# Patient Record
Sex: Female | Born: 1985 | Race: White | Hispanic: No | Marital: Single | Smoking: Current every day smoker
Health system: Southern US, Community
[De-identification: ages and names within clinical notes are randomized; demographics above are authoritative.]

## PROBLEM LIST (undated history)

## (undated) DIAGNOSIS — E282 Polycystic ovarian syndrome: Secondary | ICD-10-CM

## (undated) DIAGNOSIS — J45909 Unspecified asthma, uncomplicated: Secondary | ICD-10-CM

## (undated) HISTORY — PX: TONSILLECTOMY: SHX5217

## (undated) HISTORY — DX: Unspecified asthma, uncomplicated: J45.909

## (undated) HISTORY — DX: Polycystic ovarian syndrome: E28.2

## (undated) HISTORY — PX: CHOLECYSTECTOMY: SHX55

---

## 2007-06-14 ENCOUNTER — Ambulatory Visit: Payer: Self-pay | Admitting: Physician Assistant

## 2008-03-18 ENCOUNTER — Emergency Department: Payer: Self-pay | Admitting: Emergency Medicine

## 2009-03-01 IMAGING — US US PELV - US TRANSVAGINAL
1 series · 17 of 25 positions shown · non-contrast
Comparison: none

REASON FOR EXAM: Pain, history of PCOS
COMMENTS:

[Series 1: us pelv - us transvaginal · 17 of 32 slices shown]
[im 1/32]
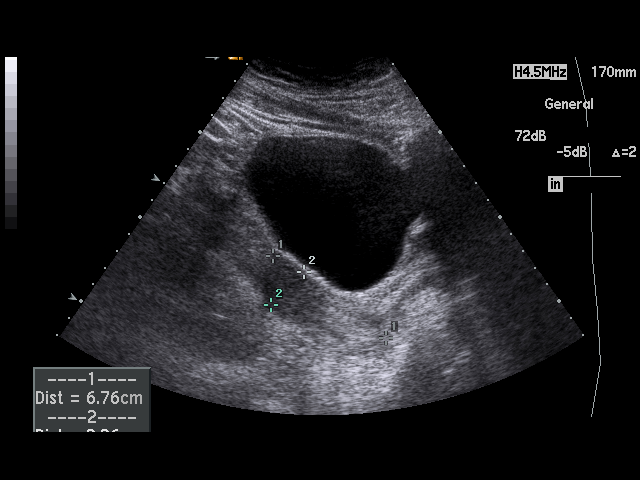
[im 3/32]
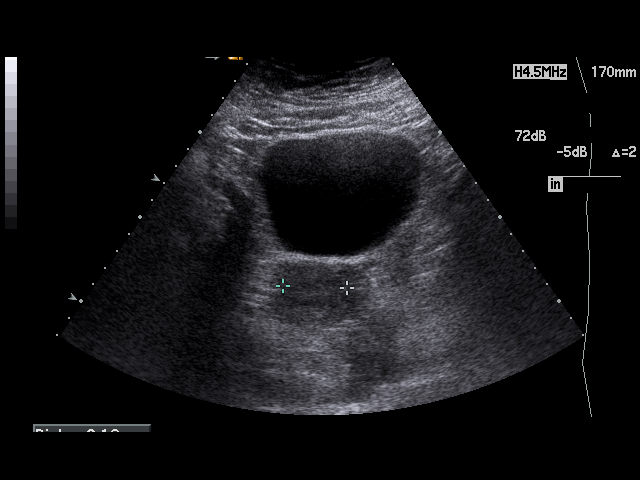
[im 4/32]
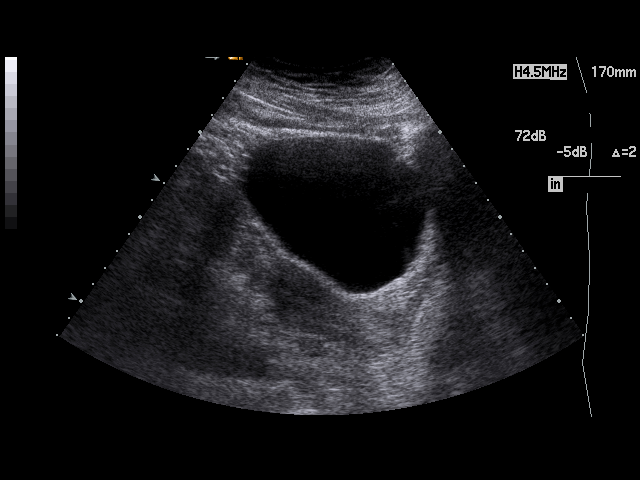
[im 7/32]
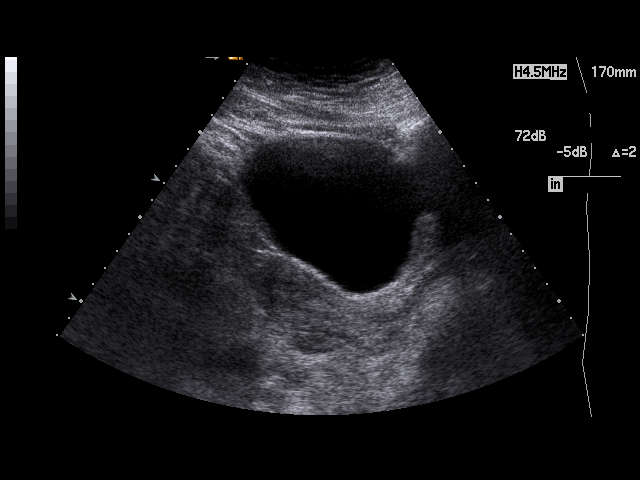
[im 8/32]
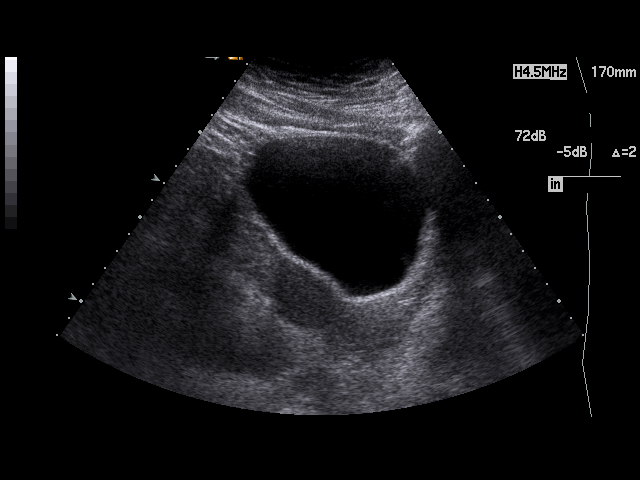
[im 11/32]
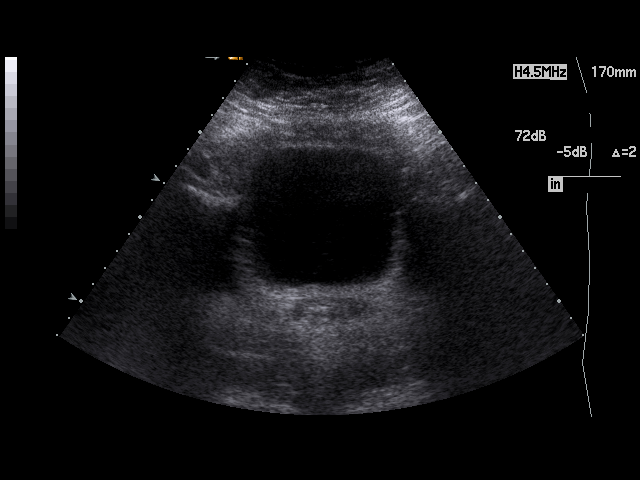
[im 12/32]
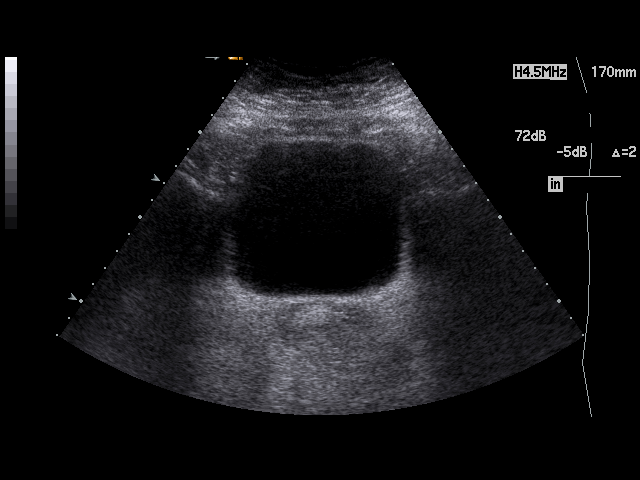
[im 15/32]
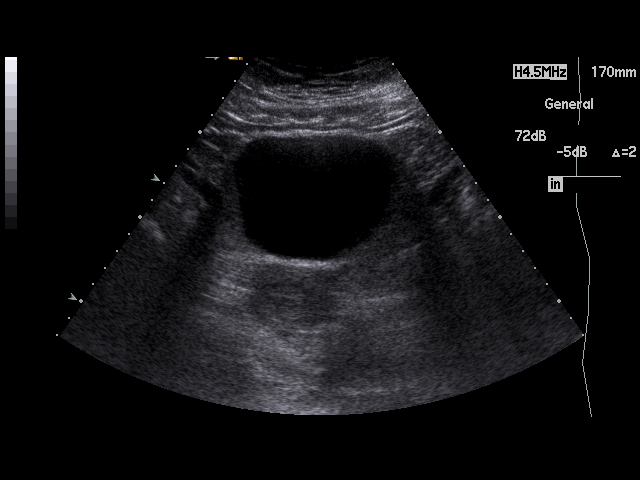
[im 16/32]
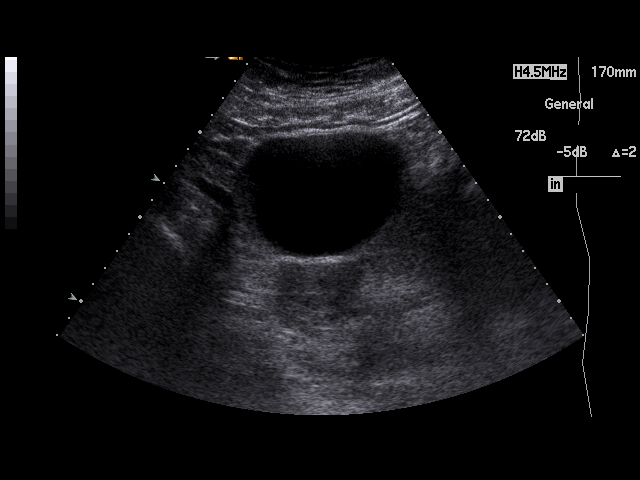
[im 17/32]
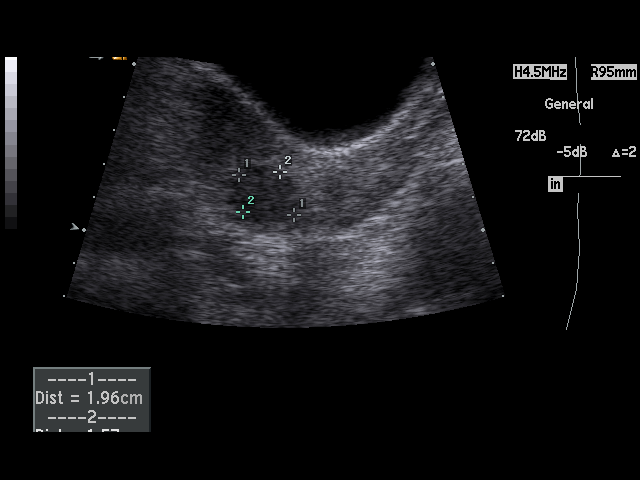
[im 20/32]
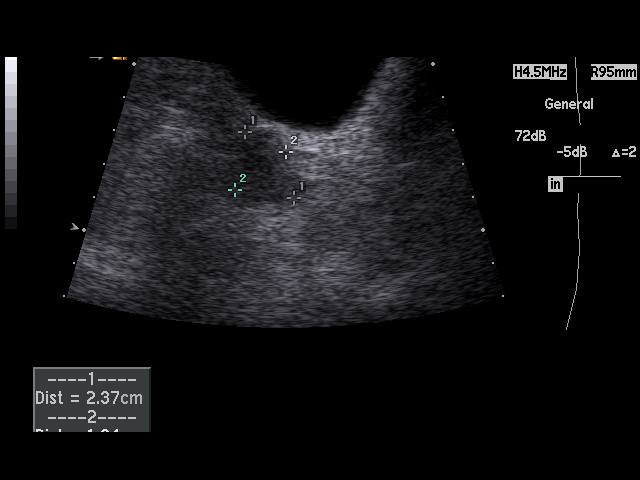
[im 21/32]
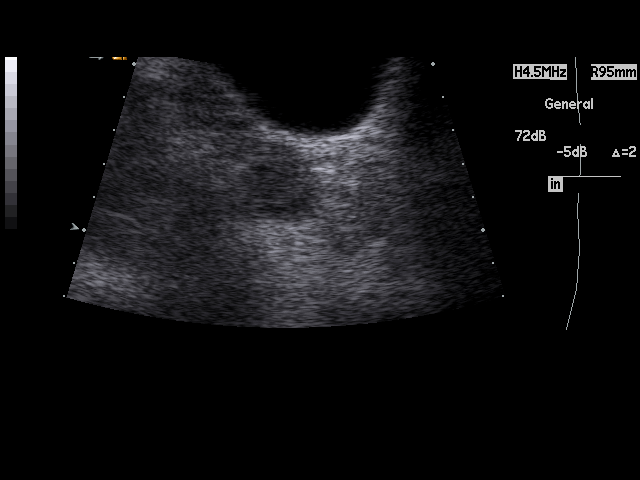
[im 24/32]
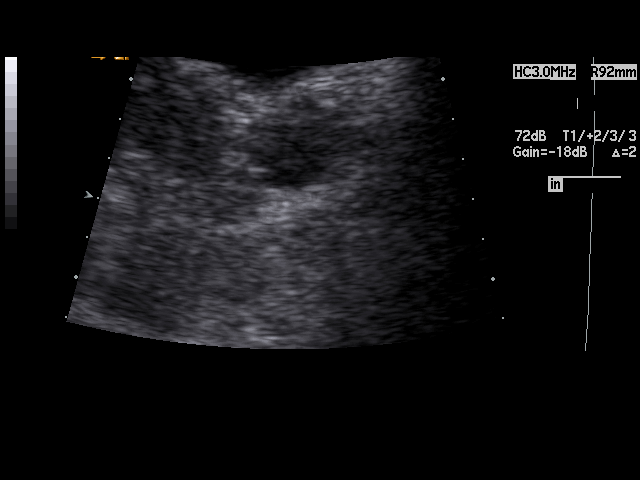
[im 25/32]
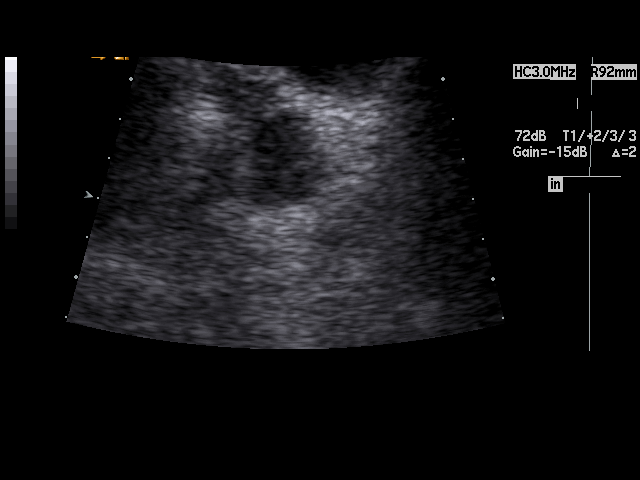
[im 28/32]
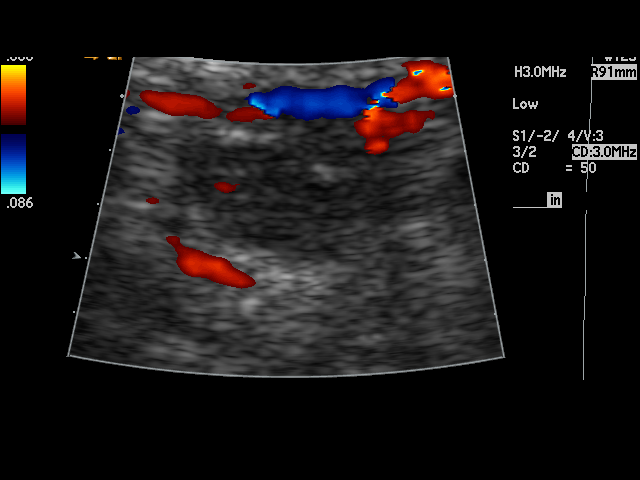
[im 29/32]
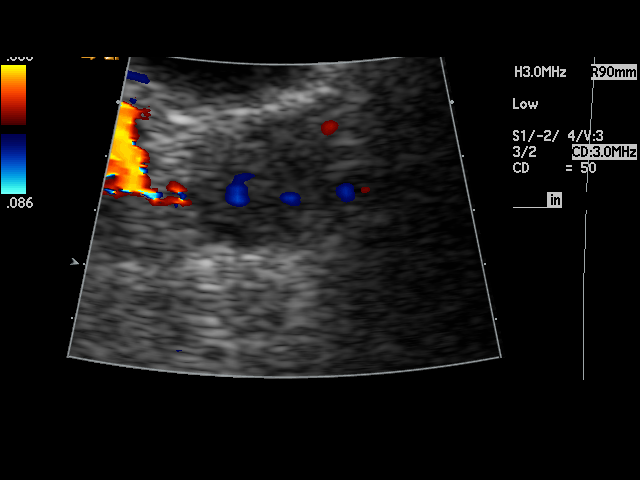
[im 32/32]
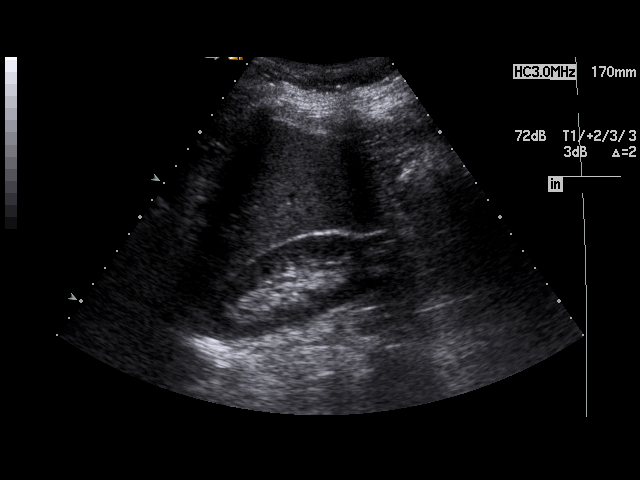

[17 of 25 positions shown; findings below may reference images not displayed]

PROCEDURE:     US  - US PELVIS MASS EXAM  - [DATE] [DATE] [DATE]  [DATE]

RESULT:     The uterus measures 6.76 x 2.26 x 3.1 cm. The endometrial
thickness is 2.5 mm. The RIGHT ovary measures 1.96 x 1.57 x 1.45 cm. The
LEFT ovary measures 2.37 x 1.84 x 3.31 cm. The urinary bladder is distended
with urine. There is no evidence of pelvic masses, free fluid or loculated
fluid collections.
IMPRESSION: Unremarkable Pelvic Ultrasound as described above.

## 2010-04-20 ENCOUNTER — Emergency Department: Payer: Self-pay | Admitting: Emergency Medicine

## 2010-09-07 ENCOUNTER — Emergency Department: Payer: Self-pay | Admitting: Emergency Medicine

## 2011-09-01 ENCOUNTER — Emergency Department: Payer: Self-pay | Admitting: *Deleted

## 2011-09-01 LAB — URINALYSIS, COMPLETE
Bilirubin,UR: NEGATIVE
Glucose,UR: NEGATIVE mg/dL (ref 0–75)
Ketone: NEGATIVE
Nitrite: NEGATIVE
Ph: 5 (ref 4.5–8.0)
Protein: NEGATIVE
WBC UR: 3 /HPF (ref 0–5)

## 2011-09-01 LAB — COMPREHENSIVE METABOLIC PANEL
Albumin: 3.9 g/dL (ref 3.4–5.0)
Alkaline Phosphatase: 55 U/L (ref 50–136)
Calcium, Total: 8.9 mg/dL (ref 8.5–10.1)
Chloride: 104 mmol/L (ref 98–107)
Co2: 29 mmol/L (ref 21–32)
Glucose: 121 mg/dL — ABNORMAL HIGH (ref 65–99)
Potassium: 3.5 mmol/L (ref 3.5–5.1)
SGOT(AST): 29 U/L (ref 15–37)
SGPT (ALT): 51 U/L
Total Protein: 7.4 g/dL (ref 6.4–8.2)

## 2011-09-01 LAB — CBC
HCT: 44.1 % (ref 35.0–47.0)
RDW: 12.3 % (ref 11.5–14.5)

## 2011-09-01 LAB — PREGNANCY, URINE: Pregnancy Test, Urine: NEGATIVE m[IU]/mL

## 2012-02-09 ENCOUNTER — Emergency Department: Payer: Self-pay | Admitting: Emergency Medicine

## 2013-10-11 ENCOUNTER — Emergency Department: Payer: Self-pay | Admitting: Emergency Medicine

## 2015-03-11 ENCOUNTER — Ambulatory Visit: Payer: Self-pay | Admitting: Family Medicine

## 2015-04-04 ENCOUNTER — Ambulatory Visit (INDEPENDENT_AMBULATORY_CARE_PROVIDER_SITE_OTHER): Payer: Self-pay | Admitting: Family Medicine

## 2015-04-04 ENCOUNTER — Encounter: Payer: Self-pay | Admitting: Family Medicine

## 2015-04-04 VITALS — BP 102/70 | HR 87 | Temp 98.0°F | Resp 16 | Ht 60.0 in | Wt 211.0 lb

## 2015-04-04 DIAGNOSIS — R5383 Other fatigue: Secondary | ICD-10-CM

## 2015-04-04 DIAGNOSIS — IMO0001 Reserved for inherently not codable concepts without codable children: Secondary | ICD-10-CM

## 2015-04-04 DIAGNOSIS — B353 Tinea pedis: Secondary | ICD-10-CM

## 2015-04-04 DIAGNOSIS — Z136 Encounter for screening for cardiovascular disorders: Secondary | ICD-10-CM

## 2015-04-04 DIAGNOSIS — J4520 Mild intermittent asthma, uncomplicated: Secondary | ICD-10-CM | POA: Insufficient documentation

## 2015-04-04 DIAGNOSIS — Z13220 Encounter for screening for lipoid disorders: Secondary | ICD-10-CM

## 2015-04-04 DIAGNOSIS — E282 Polycystic ovarian syndrome: Secondary | ICD-10-CM

## 2015-04-04 MED ORDER — TERBINAFINE HCL 1 % EX CREA: 1.0000 "application " | TOPICAL_CREAM | Freq: Two times a day (BID) | CUTANEOUS | Status: DC

## 2015-04-04 MED ORDER — SPIRONOLACTONE 25 MG PO TABS: 25.0000 mg | ORAL_TABLET | Freq: Every day | ORAL | Status: DC

## 2015-04-04 NOTE — Progress Notes (Signed)
Name: Shelley Cole   MRN: [id number]    DOB: Nov 13, 1985   Date:04/04/2015       Progress Note  Subjective  Chief Complaint  Chief Complaint  Patient presents with  . Establish Care  . Fatigue    HPI  Shelley Cole is a 29 y.o. female here today to transition care of medical needs to a primary care provider. She reports a past medical history of intermittent Asthma, has not needed her rescue inhaler in several months. [name] also reports history of PCOS diagnosis due to hirsutism (chin, jaw, anterior chest wall), irregular menses, obesity. Diagnosis confirmed with pelvic ultrasound and lab testing. Was prescribed spironolactone (dose unknown to patient) which helped with symptoms and tendency for LE swelling. 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 None today. Also notes some itching and irritation at webs of toes of both feet. Using OTC athlete's foot medication powder which is not helping.  Shelley Cole complains of fatigue today. Onset about 2-3 months ago. She is obese with a BMI of 41 and reports no regular exercise or diet changes in efforts to lose weight.     Past Medical History  Diagnosis Date  . PCOS (polycystic ovarian syndrome)   . Asthma     Patient Active Problem List   Diagnosis Date Noted  . Mild intermittent asthma in adult without complication 04/04/2015  . Other fatigue 04/04/2015  . PCOS (polycystic ovarian syndrome) 04/04/2015    Social History  Substance Use Topics  . Smoking status: Current Every Day Smoker    Types: Cigarettes  . Smokeless tobacco: Not on file  . Alcohol Use: No    No current outpatient prescriptions on file.  Past Surgical History  Procedure Laterality Date  . Cholecystectomy    . Tonsillectomy      Family History  Problem Relation Age of Onset  . COPD Father   . Emphysema Father   . Cancer Maternal Grandmother     pncreatic  . Cancer  Maternal Grandfather     nose  . Diabetes Maternal Grandfather   . Arthritis Paternal Grandmother   . COPD Paternal Grandmother   . Emphysema Paternal Grandmother     Allergies  Allergen Reactions  . Sulfa Antibiotics Nausea And Vomiting     Review of Systems  CONSTITUTIONAL: No significant weight changes, fever, chills, weakness. Yes fatigue.  HEENT:  - Eyes: No visual changes.  - Ears: No auditory changes. No pain.  - Nose: No sneezing, congestion, runny nose. - Throat: No sore throat. No changes in swallowing. SKIN: Yes skin itching. CARDIOVASCULAR: No chest pain, chest pressure or chest discomfort. No palpitations. Yes intermittent edema.  RESPIRATORY: No shortness of breath, cough or sputum.  GASTROINTESTINAL: No anorexia, nausea, vomiting. No changes in bowel habits. No abdominal pain or blood.  GENITOURINARY: No dysuria. No frequency. No discharge.  NEUROLOGICAL: No headache, dizziness, syncope, paralysis, ataxia, numbness or tingling in the extremities. No memory changes. No change in bowel or bladder control.  MUSCULOSKELETAL: No joint pain. No muscle pain. HEMATOLOGIC: No anemia, bleeding or bruising.  LYMPHATICS: No enlarged lymph nodes.  PSYCHIATRIC: No change in mood. No change in sleep pattern.  ENDOCRINOLOGIC: No reports of sweating, cold or heat intolerance. No polyuria or polydipsia.     Objective  BP 102/70 mmHg  Pulse 87  Temp(Src) 98 F (36.7 C) (Oral)  Resp 16  Ht 5' (1.524 m)  Wt 211 lb (95.709 kg)  BMI 41.21 kg/m2  SpO2 96%  LMP  Body mass index is 41.21 kg/(m^2).  Physical Exam  Constitutional: Patient is obese and well-nourished. In no distress.  HEENT:  - Head: Normocephalic and atraumatic.  - Ears: Bilateral TMs gray, no erythema or effusion - Nose: Nasal mucosa moist - Mouth/Throat: Oropharynx is clear and moist. No tonsillar hypertrophy or erythema. No post nasal drainage.  - Eyes: Conjunctivae clear, EOM movements normal. PERRLA.  No scleral icterus.  Neck: Normal range of motion. Neck supple. No JVD present. No thyromegaly present.  Cardiovascular: Normal rate, regular rhythm and normal heart sounds.  No murmur heard.  Pulmonary/Chest: Effort normal and breath sounds normal. No respiratory distress. Musculoskeletal: Normal range of motion bilateral UE and LE, no joint effusions. Peripheral vascular: Bilateral LE no edema. Bilateral feet some peeling skin and redness in between webs of toes in both feet.  Neurological: CN II-XII grossly intact with no focal deficits. Alert and oriented to person, place, and time. Coordination, balance, strength, speech and gait are normal.  Skin: Skin is warm and dry. Although she makes great effort to reduce visible female pattern hair, close observation reveals thick hair at chin, jaw line, anterior chest wall. Psychiatric: Patient has a normal mood and affect. Behavior is normal in office today. Judgment and thought content normal in office today.  Assessment & Plan  1. Mild intermittent asthma in adult without complication Stable.  2. Other fatigue Likely multifactorial. May be associated with obesity, daily work duties, stress, pcos. Will keep mood disorder in mind.  - CBC with Differential/Platelet - Comprehensive metabolic panel - Hemoglobin A1c - TSH  3. PCOS (polycystic ovarian syndrome) Re started spironolactone on patient's request, counseled on low BP potential.  - Lipid panel - Hemoglobin A1c - TSH - spironolactone (ALDACTONE) 25 MG tablet; Take 1 tablet (25 mg total) by mouth daily.  Dispense: 30 tablet; Refill: 5  4. Encounter for cholesteral screening for cardiovascular disease  - Lipid panel  5. Tinea pedis of both feet Change shoes, breathable foot wear recommended. Wear cotton socks.  - terbinafine (LAMISIL AT) 1 % cream; Apply 1 application topically 2 (two) times daily.  Dispense: 42 g; Refill: 1

## 2015-04-10 LAB — COMPREHENSIVE METABOLIC PANEL
A/G RATIO: 1.8 (ref 1.1–2.5)
ALK PHOS: 60 IU/L (ref 39–117)
ALT: 46 IU/L — AB (ref 0–32)
AST: 29 IU/L (ref 0–40)
Albumin: 4.6 g/dL (ref 3.5–5.5)
BILIRUBIN TOTAL: 0.6 mg/dL (ref 0.0–1.2)
BUN/Creatinine Ratio: 11 (ref 8–20)
BUN: 13 mg/dL (ref 6–20)
CO2: 26 mmol/L (ref 18–29)
Calcium: 9.5 mg/dL (ref 8.7–10.2)
Chloride: 102 mmol/L (ref 97–106)
Creatinine, Ser: 1.15 mg/dL — ABNORMAL HIGH (ref 0.57–1.00)
GFR calc Af Amer: 75 mL/min/{1.73_m2} (ref >59)
GFR calc non Af Amer: 65 mL/min/{1.73_m2} (ref >59)
GLUCOSE: 96 mg/dL (ref 65–99)
Globulin, Total: 2.5 g/dL (ref 1.5–4.5)
POTASSIUM: 4.7 mmol/L (ref 3.5–5.2)
Sodium: 141 mmol/L (ref 136–144)
Total Protein: 7.1 g/dL (ref 6.0–8.5)

## 2015-04-10 LAB — LIPID PANEL
CHOLESTEROL TOTAL: 213 mg/dL — AB (ref 100–199)
Chol/HDL Ratio: 5.9 ratio units — ABNORMAL HIGH (ref 0.0–4.4)
HDL: 36 mg/dL — ABNORMAL LOW (ref >39)
LDL Calculated: 128 mg/dL — ABNORMAL HIGH (ref 0–99)
TRIGLYCERIDES: 245 mg/dL — AB (ref 0–149)
VLDL Cholesterol Cal: 49 mg/dL — ABNORMAL HIGH (ref 5–40)

## 2015-04-10 LAB — CBC WITH DIFFERENTIAL/PLATELET
BASOS ABS: 0 10*3/uL (ref 0.0–0.2)
BASOS: 0 %
EOS (ABSOLUTE): 0.3 10*3/uL (ref 0.0–0.4)
Eos: 3 %
Hematocrit: 46.6 % (ref 34.0–46.6)
Hemoglobin: 16.5 g/dL — ABNORMAL HIGH (ref 11.1–15.9)
Immature Grans (Abs): 0 10*3/uL (ref 0.0–0.1)
Immature Granulocytes: 0 %
Lymphocytes Absolute: 2.6 10*3/uL (ref 0.7–3.1)
Lymphs: 31 %
MCH: 32.9 pg (ref 26.6–33.0)
MCHC: 35.4 g/dL (ref 31.5–35.7)
MCV: 93 fL (ref 79–97)
MONOS ABS: 0.6 10*3/uL (ref 0.1–0.9)
Monocytes: 7 %
Neutrophils Absolute: 4.9 10*3/uL (ref 1.4–7.0)
Neutrophils: 59 %
PLATELETS: 273 10*3/uL (ref 150–379)
RBC: 5.01 x10E6/uL (ref 3.77–5.28)
RDW: 12.9 % (ref 12.3–15.4)
WBC: 8.4 10*3/uL (ref 3.4–10.8)

## 2015-04-10 LAB — HEMOGLOBIN A1C
Est. average glucose Bld gHb Est-mCnc: 117 mg/dL
HEMOGLOBIN A1C: 5.7 % — AB (ref 4.8–5.6)

## 2015-04-10 LAB — TSH: TSH: 1.48 u[IU]/mL (ref 0.450–4.500)

## 2016-03-19 ENCOUNTER — Ambulatory Visit (INDEPENDENT_AMBULATORY_CARE_PROVIDER_SITE_OTHER): Payer: PRIVATE HEALTH INSURANCE | Admitting: Obstetrics and Gynecology

## 2016-03-19 ENCOUNTER — Encounter: Payer: Self-pay | Admitting: Obstetrics and Gynecology

## 2016-03-19 VITALS — BP 107/74 | HR 83 | Ht 60.0 in | Wt 210.6 lb

## 2016-03-19 DIAGNOSIS — E282 Polycystic ovarian syndrome: Secondary | ICD-10-CM

## 2016-03-19 DIAGNOSIS — E661 Drug-induced obesity: Secondary | ICD-10-CM

## 2016-03-19 DIAGNOSIS — Z6841 Body Mass Index (BMI) 40.0 and over, adult: Secondary | ICD-10-CM | POA: Diagnosis not present

## 2016-03-19 DIAGNOSIS — Z309 Encounter for contraceptive management, unspecified: Secondary | ICD-10-CM | POA: Diagnosis not present

## 2016-03-19 DIAGNOSIS — Z720 Tobacco use: Secondary | ICD-10-CM

## 2016-03-19 DIAGNOSIS — Z01419 Encounter for gynecological examination (general) (routine) without abnormal findings: Secondary | ICD-10-CM | POA: Diagnosis not present

## 2016-03-19 NOTE — Patient Instructions (Signed)
Health Maintenance, Female Adopting a healthy lifestyle and getting preventive care can go a long way to promote health and wellness. Talk with your health care provider about what schedule of regular examinations is right for you. This is a good chance for you to check in with your provider about disease prevention and staying healthy. In between checkups, there are plenty of things you can do on your own. Experts have done a lot of research about which lifestyle changes and preventive measures are most likely to keep you healthy. Ask your health care provider for more information. WEIGHT AND DIET  Eat a healthy diet  Be sure to include plenty of vegetables, fruits, low-fat dairy products, and lean protein.  Do not eat a lot of foods high in solid fats, added sugars, or salt.  Get regular exercise. This is one of the most important things you can do for your health.  Most adults should exercise for at least 150 minutes each week. The exercise should increase your heart rate and make you sweat (moderate-intensity exercise).  Most adults should also do strengthening exercises at least twice a week. This is in addition to the moderate-intensity exercise.  Maintain a healthy weight  Body mass index (BMI) is a measurement that can be used to identify possible weight problems. It estimates body fat based on height and weight. Your health care provider can help determine your BMI and help you achieve or maintain a healthy weight.  For females 20 years of age and older:   A BMI below 18.5 is considered underweight.  A BMI of 18.5 to 24.9 is normal.  A BMI of 25 to 29.9 is considered overweight.  A BMI of 30 and above is considered obese.  Watch levels of cholesterol and blood lipids  You should start having your blood tested for lipids and cholesterol at 29 years of age, then have this test every 5 years.  You may need to have your cholesterol levels checked more often if:  Your lipid  or cholesterol levels are high.  You are older than 29 years of age.  You are at high risk for heart disease.  CANCER SCREENING   Lung Cancer  Lung cancer screening is recommended for adults 55-80 years old who are at high risk for lung cancer because of a history of smoking.  A yearly low-dose CT scan of the lungs is recommended for people who:  Currently smoke.  Have quit within the past 15 years.  Have at least a 30-pack-year history of smoking. A pack year is smoking an average of one pack of cigarettes a day for 1 year.  Yearly screening should continue until it has been 15 years since you quit.  Yearly screening should stop if you develop a health problem that would prevent you from having lung cancer treatment.  Breast Cancer  Practice breast self-awareness. This means understanding how your breasts normally appear and feel.  It also means doing regular breast self-exams. Let your health care provider know about any changes, no matter how small.  If you are in your 20s or 30s, you should have a clinical breast exam (CBE) by a health care provider every 1-3 years as part of a regular health exam.  If you are 40 or older, have a CBE every year. Also consider having a breast X-ray (mammogram) every year.  If you have a family history of breast cancer, talk to your health care provider about genetic screening.  If you   are at high risk for breast cancer, talk to your health care provider about having an MRI and a mammogram every year.  Breast cancer gene (BRCA) assessment is recommended for women who have family members with BRCA-related cancers. BRCA-related cancers include:  Breast.  Ovarian.  Tubal.  Peritoneal cancers.  Results of the assessment will determine the need for genetic counseling and BRCA1 and BRCA2 testing. Cervical Cancer Your health care provider may recommend that you be screened regularly for cancer of the pelvic organs (ovaries, uterus, and  vagina). This screening involves a pelvic examination, including checking for microscopic changes to the surface of your cervix (Pap test). You may be encouraged to have this screening done every 3 years, beginning at age 21.  For women ages 30-65, health care providers may recommend pelvic exams and Pap testing every 3 years, or they may recommend the Pap and pelvic exam, combined with testing for human papilloma virus (HPV), every 5 years. Some types of HPV increase your risk of cervical cancer. Testing for HPV may also be done on women of any age with unclear Pap test results.  Other health care providers may not recommend any screening for nonpregnant women who are considered low risk for pelvic cancer and who do not have symptoms. Ask your health care provider if a screening pelvic exam is right for you.  If you have had past treatment for cervical cancer or a condition that could lead to cancer, you need Pap tests and screening for cancer for at least 20 years after your treatment. If Pap tests have been discontinued, your risk factors (such as having a new sexual partner) need to be reassessed to determine if screening should resume. Some women have medical problems that increase the chance of getting cervical cancer. In these cases, your health care provider may recommend more frequent screening and Pap tests. Colorectal Cancer  This type of cancer can be detected and often prevented.  Routine colorectal cancer screening usually begins at 29 years of age and continues through 29 years of age.  Your health care provider may recommend screening at an earlier age if you have risk factors for colon cancer.  Your health care provider may also recommend using home test kits to check for hidden blood in the stool.  A small camera at the end of a tube can be used to examine your colon directly (sigmoidoscopy or colonoscopy). This is done to check for the earliest forms of colorectal  cancer.  Routine screening usually begins at age 50.  Direct examination of the colon should be repeated every 5-10 years through 29 years of age. However, you may need to be screened more often if early forms of precancerous polyps or small growths are found. Skin Cancer  Check your skin from head to toe regularly.  Tell your health care provider about any new moles or changes in moles, especially if there is a change in a mole's shape or color.  Also tell your health care provider if you have a mole that is larger than the size of a pencil eraser.  Always use sunscreen. Apply sunscreen liberally and repeatedly throughout the day.  Protect yourself by wearing long sleeves, pants, a wide-brimmed hat, and sunglasses whenever you are outside. HEART DISEASE, DIABETES, AND HIGH BLOOD PRESSURE   High blood pressure causes heart disease and increases the risk of stroke. High blood pressure is more likely to develop in:  People who have blood pressure in the high end   of the normal range (130-139/85-89 mm Hg).  People who are overweight or obese.  People who are African American.  If you are 38-23 years of age, have your blood pressure checked every 3-5 years. If you are 61 years of age or older, have your blood pressure checked every year. You should have your blood pressure measured twice--once when you are at a hospital or clinic, and once when you are not at a hospital or clinic. Record the average of the two measurements. To check your blood pressure when you are not at a hospital or clinic, you can use:  An automated blood pressure machine at a pharmacy.  A home blood pressure monitor.  If you are between 45 years and 39 years old, ask your health care provider if you should take aspirin to prevent strokes.  Have regular diabetes screenings. This involves taking a blood sample to check your fasting blood sugar level.  If you are at a normal weight and have a low risk for diabetes,  have this test once every three years after 29 years of age.  If you are overweight and have a high risk for diabetes, consider being tested at a younger age or more often. PREVENTING INFECTION  Hepatitis B  If you have a higher risk for hepatitis B, you should be screened for this virus. You are considered at high risk for hepatitis B if:  You were born in a country where hepatitis B is common. Ask your health care provider which countries are considered high risk.  Your parents were born in a high-risk country, and you have not been immunized against hepatitis B (hepatitis B vaccine).  You have HIV or AIDS.  You use needles to inject street drugs.  You live with someone who has hepatitis B.  You have had sex with someone who has hepatitis B.  You get hemodialysis treatment.  You take certain medicines for conditions, including cancer, organ transplantation, and autoimmune conditions. Hepatitis C  Blood testing is recommended for:  Everyone born from [birth year] through 1964.  Anyone with known risk factors for hepatitis C. Sexually transmitted infections (STIs)  You should be screened for sexually transmitted infections (STIs) including gonorrhea and chlamydia if:  You are sexually active and are younger than 29 years of age.  You are older than 29 years of age and your health care provider tells you that you are at risk for this type of infection.  Your sexual activity has changed since you were last screened and you are at an increased risk for chlamydia or gonorrhea. Ask your health care provider if you are at risk.  If you do not have HIV, but are at risk, it may be recommended that you take a prescription medicine daily to prevent HIV infection. This is called pre-exposure prophylaxis (PrEP). You are considered at risk if:  You are sexually active and do not regularly use condoms or know the HIV status of your partner(s).  You take drugs by injection.  You are sexually  active with a partner who has HIV. Talk with your health care provider about whether you are at high risk of being infected with HIV. If you choose to begin PrEP, you should first be tested for HIV. You should then be tested every 3 months for as long as you are taking PrEP.  PREGNANCY   If you are premenopausal and you may become pregnant, ask your health care provider about preconception counseling.  If you may  become pregnant, take 400 to 800 micrograms (mcg) of folic acid every day.  If you want to prevent pregnancy, talk to your health care provider about birth control (contraception). OSTEOPOROSIS AND MENOPAUSE   Osteoporosis is a disease in which the bones lose minerals and strength with aging. This can result in serious bone fractures. Your risk for osteoporosis can be identified using a bone density scan.  If you are 61 years of age or older, or if you are at risk for osteoporosis and fractures, ask your health care provider if you should be screened.  Ask your health care provider whether you should take a calcium or vitamin D supplement to lower your risk for osteoporosis.  Menopause may have certain physical symptoms and risks.  Hormone replacement therapy may reduce some of these symptoms and risks. Talk to your health care provider about whether hormone replacement therapy is right for you.  HOME CARE INSTRUCTIONS   Schedule regular health, dental, and eye exams.  Stay current with your immunizations.   Do not use any tobacco products including cigarettes, chewing tobacco, or electronic cigarettes.  If you are pregnant, do not drink alcohol.  If you are breastfeeding, limit how much and how often you drink alcohol.  Limit alcohol intake to no more than 1 drink per day for nonpregnant women. One drink equals 12 ounces of beer, 5 ounces of wine, or 1 ounces of hard liquor.  Do not use street drugs.  Do not share needles.  Ask your health care provider for help if  you need support or information about quitting drugs.  Tell your health care provider if you often feel depressed.  Tell your health care provider if you have ever been abused or do not feel safe at home.   This information is not intended to replace advice given to you by your health care provider. Make sure you discuss any questions you have with your health care provider.   Document Released: 11/17/2010 Document Revised: 05/25/2014 Document Reviewed: 04/05/2013 Elsevier Interactive Patient Education [employer].

## 2016-03-19 NOTE — Progress Notes (Signed)
ANNUAL PREVENTATIVE CARE GYN  ENCOUNTER NOTE  Subjective:       Shelley Cole is a 30 y.o. G0P0000 female here for a routine annual gynecologic exam.  Current complaints: 1.  Refill spironolactone  2. wants tsh drawn  Long history of PCO since age 30. Has irregular cycles, usually more than 4 per year. Occasional intermenstrual No history of menorrhagia. Recently was seen in primary care; needs TSH testing. Long history of tobacco use.         Gynecologic History Patient's last menstrual period was 11/16/2015 (approximate). Contraception: none Last Pap: .? Results were: normal Last mammogram: n/a.  Obstetric History OB History  Gravida Para Term Preterm AB Living  0 0 0 0 0 0  SAB TAB Ectopic Multiple Live Births  0 0 0 0 0        Past Medical History:  Diagnosis Date  . Asthma   . PCOS (polycystic ovarian syndrome)     Past Surgical History:  Procedure Laterality Date  . CHOLECYSTECTOMY    . TONSILLECTOMY      Current Outpatient Prescriptions on File Prior to Visit  Medication Sig Dispense Refill  . spironolactone (ALDACTONE) 25 MG tablet Take 1 tablet (25 mg total) by mouth daily. 30 tablet 5   No current facility-administered medications on file prior to visit.     Allergies  Allergen Reactions  . Sulfa Antibiotics Nausea And Vomiting    Social History   Social History  . Marital status: Single    Spouse name: N/A  . Number of children: N/A  . Years of education: N/A   Occupational History  . Not on file.   Social History Main Topics  . Smoking status: Current Every Day Smoker    Types: Cigarettes  . Smokeless tobacco: Not on file  . Alcohol use No  . Drug use: No  . Sexual activity: Yes    Partners: Male   Other Topics Concern  . Not on file   Social History Narrative  . No narrative on file    Family History  Problem Relation Age of Onset  . COPD Father   . Emphysema Father   . Cancer Maternal Grandmother     pncreatic  .  Cancer Maternal Grandfather     nose  . Diabetes Maternal Grandfather   . Arthritis Paternal Grandmother   . COPD Paternal Grandmother   . Emphysema Paternal Grandmother     The following portions of the patient's history were reviewed and updated as appropriate: allergies, current medications, past family history, past medical history, past social history, past surgical history and problem list.  Review of Systems ROS Review of Systems - General ROS: negative for - chills, fatigue, fever, hot flashes, night sweats, weight gain or weight loss Psychological ROS: negative for - anxiety, decreased libido, depression, mood swings, physical abuse or sexual abuse Ophthalmic ROS: negative for - blurry vision, eye pain or loss of vision ENT ROS: negative for - headaches, hearing change, visual changes or vocal changes Allergy and Immunology ROS: negative for - hives, itchy/watery eyes or seasonal allergies Hematological and Lymphatic ROS: negative for - bleeding problems, bruising, swollen lymph nodes or weight loss Endocrine ROS: negative for - galactorrhea, hair pattern changes, hot flashes, malaise/lethargy, mood swings, palpitations, polydipsia/polyuria, skin changes, temperature intolerance or unexpected weight changes Breast ROS: negative for - new or changing breast lumps or nipple discharge Respiratory ROS: negative for - cough or shortness of breath Cardiovascular ROS: negative  for - chest pain, irregular heartbeat, palpitations or shortness of breath Gastrointestinal ROS: no abdominal pain, change in bowel habits, or black or bloody stools Genito-Urinary ROS: no dysuria, trouble voiding, or hematuria Musculoskeletal ROS: negative for - joint pain or joint stiffness Neurological ROS: negative for - bowel and bladder control changes Dermatological ROS: negative for rash and skin lesion changes   Objective:   BP 107/74   Pulse 83   Ht 5' (1.524 m)   Wt 210 lb 10.1 oz (95.5 kg)   LMP  11/16/2015 (Approximate)   BMI 41.14 kg/m  CONSTITUTIONAL: Well-developed, well-nourished female in no acute distress.  PSYCHIATRIC: Normal mood and affect. Normal behavior. Normal judgment and thought content. NEUROLGIC: Alert and oriented to person, place, and time. Normal muscle tone coordination. No cranial nerve deficit noted. HENT:  Normocephalic, atraumatic, External right and left ear normal. Oropharynx is clear and moist EYES: Conjunctivae and EOM are normal. Pupils are equal, round, and reactive to light. No scleral icterus.  NECK: Normal range of motion, supple, no masses.  Normal thyroid.  SKIN: Skin is warm and dry. No rash noted. Not diaphoretic. No erythema. No pallor. CARDIOVASCULAR: Normal heart rate noted, regular rhythm, no murmur. RESPIRATORY: Clear to auscultation bilaterally. Effort and breath sounds normal, no problems with respiration noted. BREASTS: Symmetric in size. No masses, skin changes, nipple drainage, or lymphadenopathy. ABDOMEN: Soft, normal bowel sounds, no distention noted.  No tenderness, rebound or guarding.  BLADDER: Normal PELVIC:  External Genitalia: Normal  BUS: Normal  Vagina: Normal  Cervix: Normal; no lesions; no cervical motion tenderness  Uterus: Normal; midplane, normal size and shape, mobile, nontender  Adnexa: Normal  RV: External Exam NormaI  MUSCULOSKELETAL: Normal range of motion. No tenderness.  No cyanosis, clubbing, or edema.  2+ distal pulses. LYMPHATIC: No Axillary, Supraclavicular, or Inguinal Adenopathy.    Assessment:   Annual gynecologic examination 30 y.o. Contraception: none bmi-41 PCO Irregular menstrual cycles Contraception-not desired  tobacco user Plan:  Pap: Pap, Reflex if ASCUS Mammogram: Not Indicated Stool Guaiac Testing:  Not Indicated Labs:TSH only per pt Routine preventative health maintenance measures emphasized: Exercise/Diet/Weight control, Tobacco Warnings, Alcohol/Substance use risks, Stress  Management, Peer Pressure Issues and Safe Sex Spironolactone 25 mg twice a day Return to Clinic - 1 Year   Crystal The [place], CMA  Herold HarmsMartin A Jakari Sada, MD  Note: This dictation was prepared with Dragon dictation along with smaller phrase technology. Any transcriptional errors that result from this process are unintentional.

## 2016-03-20 ENCOUNTER — Other Ambulatory Visit: Payer: Self-pay

## 2016-03-20 DIAGNOSIS — E282 Polycystic ovarian syndrome: Secondary | ICD-10-CM

## 2016-03-20 LAB — TSH: TSH: 2.88 u[IU]/mL (ref 0.450–4.500)

## 2016-03-20 MED ORDER — SPIRONOLACTONE 25 MG PO TABS: 25.0000 mg | ORAL_TABLET | Freq: Two times a day (BID) | ORAL | 6 refills | Status: DC

## 2016-03-20 NOTE — Telephone Encounter (Signed)
Done by gyn

## 2016-03-20 NOTE — Addendum Note (Signed)
Addended by: Marchelle FolksMILLER, Hammond Obeirne G on: 03/20/2016 02:43 PM   Modules accepted: Orders

## 2016-03-24 LAB — PAP IG W/ RFLX HPV ASCU: PAP Smear Comment: 0

## 2017-03-22 NOTE — Progress Notes (Signed)
ANNUAL PREVENTATIVE CARE GYN  ENCOUNTER NOTE  Subjective:       Shelley Cole is a 31 y.o. G0P0000 female here for a routine annual gynecologic exam.  Current complaints: 1.  Refill spironolactone- wants to increase it    Hair growth is still an issue as she does have to shave periodically.  She is on Spironolactone 50 mg a day. Patient has had 5 cycles this year to date.  No significant intermenstrual spotting or bleeding. Bowel and bladder function are normal.  Long history of PCO since age 31. Has irregular cycles, usually more than 4 per year. Occasional intermenstrual spotting.  No history of menorrhagia. Long history of tobacco use.       Gynecologic History LMP- 03/19/2017 Contraception: none Last Pap: .2017 neg Results were: normal Last mammogram: n/a.  Obstetric History OB History  Gravida Para Term Preterm AB Living  0 0 0 0 0 0  SAB TAB Ectopic Multiple Live Births  0 0 0 0 0        Past Medical History:  Diagnosis Date  . Asthma   . PCOS (polycystic ovarian syndrome)     Past Surgical History:  Procedure Laterality Date  . CHOLECYSTECTOMY    . TONSILLECTOMY      Current Outpatient Medications on File Prior to Visit  Medication Sig Dispense Refill  . spironolactone (ALDACTONE) 25 MG tablet Take 1 tablet (25 mg total) by mouth 2 (two) times daily. 60 tablet 6   No current facility-administered medications on file prior to visit.     Allergies  Allergen Reactions  . Sulfa Antibiotics Nausea And Vomiting    Social History   Socioeconomic History  . Marital status: Single    Spouse name: Not on file  . Number of children: Not on file  . Years of education: Not on file  . Highest education level: Not on file  Social Needs  . Financial resource strain: Not on file  . Food insecurity - worry: Not on file  . Food insecurity - inability: Not on file  . Transportation needs - medical: Not on file  . Transportation needs - non-medical: Not on  file  Occupational History  . Not on file  Tobacco Use  . Smoking status: Current Every Day Smoker    Packs/day: 0.25    Types: Cigarettes  . Smokeless tobacco: Never Used  Substance and Sexual Activity  . Alcohol use: Yes    Alcohol/week: 0.0 oz    Comment: occas  . Drug use: No  . Sexual activity: Yes    Partners: Male    Birth control/protection: None  Other Topics Concern  . Not on file  Social History Narrative  . Not on file    Family History  Problem Relation Age of Onset  . COPD Father   . Emphysema Father   . Cancer Maternal Grandmother        pncreatic  . Cancer Maternal Grandfather        nose  . Diabetes Maternal Grandfather   . Arthritis Paternal Grandmother   . COPD Paternal Grandmother   . Emphysema Paternal Grandmother   . Breast cancer Neg Hx   . Ovarian cancer Neg Hx   . Colon cancer Neg Hx     The following portions of the patient's history were reviewed and updated as appropriate: allergies, current medications, past family history, past medical history, past social history, past surgical history and problem list.  Review of Systems  Review of Systems  Constitutional: Negative.   HENT: Negative.   Eyes: Negative.   Respiratory: Negative.   Cardiovascular: Negative.   Gastrointestinal: Negative.   Genitourinary: Negative.   Musculoskeletal: Negative.   Skin: Negative.   Neurological: Negative.   Endo/Heme/Allergies:       Chronic hair growth persists due to PCO  Psychiatric/Behavioral: Negative.       BP 109/70   Pulse 75   Ht 5' (1.524 m)   Wt 214 lb 4.8 oz (97.2 kg)   LMP 03/19/2017 (Exact Date)   BMI 41.85 kg/m  CONSTITUTIONAL: Well-developed, well-nourished female in no acute distress.  PSYCHIATRIC: Normal mood and affect. Normal behavior. Normal judgment and thought content. NEUROLGIC: Alert and oriented to person, place, and time. Normal muscle tone coordination. No cranial nerve deficit noted. HENT:  Normocephalic,  atraumatic, External right and left ear normal. Oropharynx is clear and moist.  Temporal balding noted EYES: Conjunctivae and EOM are normal. No scleral icterus.  NECK: Normal range of motion, supple, no masses.  Normal thyroid.  SKIN: Skin is warm and dry. No rash noted. Not diaphoretic. No erythema. No pallor. CARDIOVASCULAR: Normal heart rate noted, regular rhythm, no murmur. RESPIRATORY: Clear to auscultation bilaterally. Effort and breath sounds normal, no problems with respiration noted. BREASTS: Symmetric in size. No masses, skin changes, nipple drainage, or lymphadenopathy. ABDOMEN: Soft, normal bowel sounds, no distention noted.  No tenderness, rebound or guarding.  BLADDER: Normal PELVIC:  External Genitalia: Normal  BUS: Normal  Vagina: Normal; minimal menstrual blood in vault  Cervix: Normal; no lesions; no cervical motion tenderness  Uterus: Normal; midplane, normal size and shape, mobile, nontender  Adnexa: Normal  RV: External Exam NormaI  MUSCULOSKELETAL: Normal range of motion. No tenderness.  No cyanosis, clubbing, or edema.  2+ distal pulses. LYMPHATIC: No Axillary, Supraclavicular, or Inguinal Adenopathy.    Assessment:   Annual gynecologic examination 31 y.o. Contraception: none bmi-41 PCO Irregular menstrual cycles Contraception-not desired  tobacco user  Plan:  Pap: PAP/hpv Mammogram: Not Indicated Stool Guaiac Testing:  Not Indicated Labs:tsh lipid fbs a1c Routine preventative health maintenance measures emphasized: Exercise/Diet/Weight control, Tobacco Warnings, Alcohol/Substance use risks, Stress Management, Peer Pressure Issues and Safe Sex  Increase spironolactone to 100 mg daily Strongly encouraged to quit smoking Return to Clinic - 1 Year   Crystal [place], CMA  Herold Harms, MD  Note: This dictation was prepared with Dragon dictation along with smaller phrase technology. Any transcriptional errors that result from this process are  unintentional.

## 2017-03-24 ENCOUNTER — Ambulatory Visit (INDEPENDENT_AMBULATORY_CARE_PROVIDER_SITE_OTHER): Payer: PRIVATE HEALTH INSURANCE | Admitting: Obstetrics and Gynecology

## 2017-03-24 ENCOUNTER — Encounter: Payer: Self-pay | Admitting: Obstetrics and Gynecology

## 2017-03-24 VITALS — BP 109/70 | HR 75 | Ht 60.0 in | Wt 214.3 lb

## 2017-03-24 DIAGNOSIS — E282 Polycystic ovarian syndrome: Secondary | ICD-10-CM

## 2017-03-24 DIAGNOSIS — Z6841 Body Mass Index (BMI) 40.0 and over, adult: Secondary | ICD-10-CM | POA: Diagnosis not present

## 2017-03-24 DIAGNOSIS — Z309 Encounter for contraceptive management, unspecified: Secondary | ICD-10-CM | POA: Insufficient documentation

## 2017-03-24 DIAGNOSIS — Z720 Tobacco use: Secondary | ICD-10-CM | POA: Diagnosis not present

## 2017-03-24 DIAGNOSIS — Z01419 Encounter for gynecological examination (general) (routine) without abnormal findings: Secondary | ICD-10-CM | POA: Diagnosis not present

## 2017-03-24 DIAGNOSIS — E661 Drug-induced obesity: Secondary | ICD-10-CM

## 2017-03-24 MED ORDER — SPIRONOLACTONE 100 MG PO TABS: 100.0000 mg | ORAL_TABLET | Freq: Every day | ORAL | 11 refills | Status: DC

## 2017-03-24 NOTE — Patient Instructions (Signed)
1.  Pap smear is done 2.  Self breast awareness is encouraged 3.  Screening labs are ordered 4.  Continue with healthy eating, exercise, and control weight loss.  Weight loss goal is 1 pound per month 5.  Increase spironolactone to 100 mg daily for PCO management of excess hair growth 6.  Contraception-none 7.  Return in 1 year for physical  Health Maintenance, Female Adopting a healthy lifestyle and getting preventive care can go a long way to promote health and wellness. Talk with your health care provider about what schedule of regular examinations is right for you. This is a good chance for you to check in with your provider about disease prevention and staying healthy. In between checkups, there are plenty of things you can do on your own. Experts have done a lot of research about which lifestyle changes and preventive measures are most likely to keep you healthy. Ask your health care provider for more information. Weight and diet Eat a healthy diet  Be sure to include plenty of vegetables, fruits, low-fat dairy products, and lean protein.  Do not eat a lot of foods high in solid fats, added sugars, or salt.  Get regular exercise. This is one of the most important things you can do for your health. ? Most adults should exercise for at least 150 minutes each week. The exercise should increase your heart rate and make you sweat (moderate-intensity exercise). ? Most adults should also do strengthening exercises at least twice a week. This is in addition to the moderate-intensity exercise.  Maintain a healthy weight  Body mass index (BMI) is a measurement that can be used to identify possible weight problems. It estimates body fat based on height and weight. Your health care provider can help determine your BMI and help you achieve or maintain a healthy weight.  For females 69 years of age and older: ? A BMI below 18.5 is considered underweight. ? A BMI of 18.5 to 24.9 is normal. ? A  BMI of 25 to 29.9 is considered overweight. ? A BMI of 30 and above is considered obese.  Watch levels of cholesterol and blood lipids  You should start having your blood tested for lipids and cholesterol at 30 years of age, then have this test every 5 years.  You may need to have your cholesterol levels checked more often if: ? Your lipid or cholesterol levels are high. ? You are older than 30 years of age. ? You are at high risk for heart disease.  Cancer screening Lung Cancer  Lung cancer screening is recommended for adults 41-32 years old who are at high risk for lung cancer because of a history of smoking.  A yearly low-dose CT scan of the lungs is recommended for people who: ? Currently smoke. ? Have quit within the past 15 years. ? Have at least a 30-pack-year history of smoking. A pack year is smoking an average of one pack of cigarettes a day for 1 year.  Yearly screening should continue until it has been 15 years since you quit.  Yearly screening should stop if you develop a health problem that would prevent you from having lung cancer treatment.  Breast Cancer  Practice breast self-awareness. This means understanding how your breasts normally appear and feel.  It also means doing regular breast self-exams. Let your health care provider know about any changes, no matter how small.  If you are in your 20s or 30s, you should have a  clinical breast exam (CBE) by a health care provider every 1-3 years as part of a regular health exam.  If you are 26 or older, have a CBE every year. Also consider having a breast X-ray (mammogram) every year.  If you have a family history of breast cancer, talk to your health care provider about genetic screening.  If you are at high risk for breast cancer, talk to your health care provider about having an MRI and a mammogram every year.  Breast cancer gene (BRCA) assessment is recommended for women who have family members with  BRCA-related cancers. BRCA-related cancers include: ? Breast. ? Ovarian. ? Tubal. ? Peritoneal cancers.  Results of the assessment will determine the need for genetic counseling and BRCA1 and BRCA2 testing.  Cervical Cancer Your health care provider may recommend that you be screened regularly for cancer of the pelvic organs (ovaries, uterus, and vagina). This screening involves a pelvic examination, including checking for microscopic changes to the surface of your cervix (Pap test). You may be encouraged to have this screening done every 3 years, beginning at age 30.  For women ages 20-65, health care providers may recommend pelvic exams and Pap testing every 3 years, or they may recommend the Pap and pelvic exam, combined with testing for human papilloma virus (HPV), every 5 years. Some types of HPV increase your risk of cervical cancer. Testing for HPV may also be done on women of any age with unclear Pap test results.  Other health care providers may not recommend any screening for nonpregnant women who are considered low risk for pelvic cancer and who do not have symptoms. Ask your health care provider if a screening pelvic exam is right for you.  If you have had past treatment for cervical cancer or a condition that could lead to cancer, you need Pap tests and screening for cancer for at least 20 years after your treatment. If Pap tests have been discontinued, your risk factors (such as having a new sexual partner) need to be reassessed to determine if screening should resume. Some women have medical problems that increase the chance of getting cervical cancer. In these cases, your health care provider may recommend more frequent screening and Pap tests.  Colorectal Cancer  This type of cancer can be detected and often prevented.  Routine colorectal cancer screening usually begins at 30 years of age and continues through 30 years of age.  Your health care provider may recommend screening  at an earlier age if you have risk factors for colon cancer.  Your health care provider may also recommend using home test kits to check for hidden blood in the stool.  A small camera at the end of a tube can be used to examine your colon directly (sigmoidoscopy or colonoscopy). This is done to check for the earliest forms of colorectal cancer.  Routine screening usually begins at age 3.  Direct examination of the colon should be repeated every 5-10 years through 30 years of age. However, you may need to be screened more often if early forms of precancerous polyps or small growths are found.  Skin Cancer  Check your skin from head to toe regularly.  Tell your health care provider about any new moles or changes in moles, especially if there is a change in a mole's shape or color.  Also tell your health care provider if you have a mole that is larger than the size of a pencil eraser.  Always use sunscreen.  Apply sunscreen liberally and repeatedly throughout the day.  Protect yourself by wearing long sleeves, pants, a wide-brimmed hat, and sunglasses whenever you are outside.  Heart disease, diabetes, and high blood pressure  High blood pressure causes heart disease and increases the risk of stroke. High blood pressure is more likely to develop in: ? People who have blood pressure in the high end of the normal range (130-139/85-89 mm Hg). ? People who are overweight or obese. ? People who are African American.  If you are 36-61 years of age, have your blood pressure checked every 3-5 years. If you are 33 years of age or older, have your blood pressure checked every year. You should have your blood pressure measured twice-once when you are at a hospital or clinic, and once when you are not at a hospital or clinic. Record the average of the two measurements. To check your blood pressure when you are not at a hospital or clinic, you can use: ? An automated blood pressure machine at a  pharmacy. ? A home blood pressure monitor.  If you are between 50 years and 65 years old, ask your health care provider if you should take aspirin to prevent strokes.  Have regular diabetes screenings. This involves taking a blood sample to check your fasting blood sugar level. ? If you are at a normal weight and have a low risk for diabetes, have this test once every three years after 30 years of age. ? If you are overweight and have a high risk for diabetes, consider being tested at a younger age or more often. Preventing infection Hepatitis B  If you have a higher risk for hepatitis B, you should be screened for this virus. You are considered at high risk for hepatitis B if: ? You were born in a country where hepatitis B is common. Ask your health care provider which countries are considered high risk. ? Your parents were born in a high-risk country, and you have not been immunized against hepatitis B (hepatitis B vaccine). ? You have HIV or AIDS. ? You use needles to inject street drugs. ? You live with someone who has hepatitis B. ? You have had sex with someone who has hepatitis B. ? You get hemodialysis treatment. ? You take certain medicines for conditions, including cancer, organ transplantation, and autoimmune conditions.  Hepatitis C  Blood testing is recommended for: ? Everyone born from [birth year] through 1964. ? Anyone with known risk factors for hepatitis C.  Sexually transmitted infections (STIs)  You should be screened for sexually transmitted infections (STIs) including gonorrhea and chlamydia if: ? You are sexually active and are younger than 30 years of age. ? You are older than 31 years of age and your health care provider tells you that you are at risk for this type of infection. ? Your sexual activity has changed since you were last screened and you are at an increased risk for chlamydia or gonorrhea. Ask your health care provider if you are at risk.  If you do not  have HIV, but are at risk, it may be recommended that you take a prescription medicine daily to prevent HIV infection. This is called pre-exposure prophylaxis (PrEP). You are considered at risk if: ? You are sexually active and do not regularly use condoms or know the HIV status of your partner(s). ? You take drugs by injection. ? You are sexually active with a partner who has HIV.  Talk with your health care  provider about whether you are at high risk of being infected with HIV. If you choose to begin PrEP, you should first be tested for HIV. You should then be tested every 3 months for as long as you are taking PrEP. Pregnancy  If you are premenopausal and you may become pregnant, ask your health care provider about preconception counseling.  If you may become pregnant, take 400 to 800 micrograms (mcg) of folic acid every day.  If you want to prevent pregnancy, talk to your health care provider about birth control (contraception). Osteoporosis and menopause  Osteoporosis is a disease in which the bones lose minerals and strength with aging. This can result in serious bone fractures. Your risk for osteoporosis can be identified using a bone density scan.  If you are 33 years of age or older, or if you are at risk for osteoporosis and fractures, ask your health care provider if you should be screened.  Ask your health care provider whether you should take a calcium or vitamin D supplement to lower your risk for osteoporosis.  Menopause may have certain physical symptoms and risks.  Hormone replacement therapy may reduce some of these symptoms and risks. Talk to your health care provider about whether hormone replacement therapy is right for you. Follow these instructions at home:  Schedule regular health, dental, and eye exams.  Stay current with your immunizations.  Do not use any tobacco products including cigarettes, chewing tobacco, or electronic cigarettes.  If you are pregnant,  do not drink alcohol.  If you are breastfeeding, limit how much and how often you drink alcohol.  Limit alcohol intake to no more than 1 drink per day for nonpregnant women. One drink equals 12 ounces of beer, 5 ounces of wine, or 1 ounces of hard liquor.  Do not use street drugs.  Do not share needles.  Ask your health care provider for help if you need support or information about quitting drugs.  Tell your health care provider if you often feel depressed.  Tell your health care provider if you have ever been abused or do not feel safe at home. This information is not intended to replace advice given to you by your health care provider. Make sure you discuss any questions you have with your health care provider. Document Released: 11/17/2010 Document Revised: 10/10/2015 Document Reviewed: 02/05/2015 Elsevier Interactive Patient Education  [employer].

## 2017-04-02 ENCOUNTER — Other Ambulatory Visit: Payer: PRIVATE HEALTH INSURANCE

## 2017-04-02 LAB — IGP, COBASHPV16/18
HPV 16: NEGATIVE
HPV 18: NEGATIVE
HPV OTHER HR TYPES: NEGATIVE
PAP SMEAR COMMENT: 0

## 2017-04-05 ENCOUNTER — Other Ambulatory Visit: Payer: PRIVATE HEALTH INSURANCE

## 2017-04-06 LAB — HEMOGLOBIN A1C
Est. average glucose Bld gHb Est-mCnc: 117 mg/dL
HEMOGLOBIN A1C: 5.7 % — AB (ref 4.8–5.6)

## 2017-04-06 LAB — LIPID PANEL
CHOL/HDL RATIO: 5.9 ratio — AB (ref 0.0–4.4)
Cholesterol, Total: 219 mg/dL — ABNORMAL HIGH (ref 100–199)
HDL: 37 mg/dL — ABNORMAL LOW (ref >39)
Triglycerides: 408 mg/dL — ABNORMAL HIGH (ref 0–149)

## 2017-04-06 LAB — TSH: TSH: 2.48 u[IU]/mL (ref 0.450–4.500)

## 2017-04-06 LAB — GLUCOSE, RANDOM: GLUCOSE: 91 mg/dL (ref 65–99)

## 2017-04-07 ENCOUNTER — Encounter: Payer: PRIVATE HEALTH INSURANCE | Admitting: Obstetrics and Gynecology

## 2017-04-07 ENCOUNTER — Encounter: Payer: Self-pay | Admitting: Obstetrics and Gynecology

## 2017-04-07 ENCOUNTER — Ambulatory Visit (INDEPENDENT_AMBULATORY_CARE_PROVIDER_SITE_OTHER): Payer: PRIVATE HEALTH INSURANCE | Admitting: Obstetrics and Gynecology

## 2017-04-07 VITALS — BP 104/68 | HR 76 | Ht 60.0 in | Wt 219.0 lb

## 2017-04-07 DIAGNOSIS — A5901 Trichomonal vulvovaginitis: Secondary | ICD-10-CM | POA: Diagnosis not present

## 2017-04-07 DIAGNOSIS — E782 Mixed hyperlipidemia: Secondary | ICD-10-CM | POA: Diagnosis not present

## 2017-04-07 MED ORDER — METRONIDAZOLE 500 MG PO TABS: 500.0000 mg | ORAL_TABLET | Freq: Two times a day (BID) | ORAL | 0 refills | Status: DC

## 2017-04-07 NOTE — Progress Notes (Signed)
Chief complaint: 1.  Abnormal Pap smear-trichomonas 2.  Elevated triglycerides, cholesterol, and hemoglobin A1c  Shelley Cole presents today for confirmation of trichomonas vaginalis infection. Review of screening labs obtained from prior annual exam was completed.  Past Medical History:  Diagnosis Date  . Asthma   . PCOS (polycystic ovarian syndrome)    Past Surgical History:  Procedure Laterality Date  . CHOLECYSTECTOMY    . TONSILLECTOMY      OBJECTIVE: BP 104/68   Pulse 76   Ht 5' (1.524 m)   Wt 219 lb (99.3 kg)   LMP 03/19/2017 (Exact Date)   BMI 42.77 kg/m  Pleasant female in no acute distress Pelvic exam: External genitalia-normal BUS-normal Vagina-minimal gray white secretions in the vault  PROCEDURE: Wet prep  Normal saline-moderate white blood cells present; motile Trichomonas seen  ASSESSMENT: 1.  Trichomonas vaginalis 2.  Hypertriglyceridemia 3.  Elevated hemoglobin A1c consistent with prediabetes  PLAN: 1.  Wet prep is noted 2.  Flagyl 500 mg twice a day for 7 days 3.  Referral to primary care for evaluation and management of hypertriglyceridemia and elevated hemoglobin A1c  A total of 15 minutes were spent face-to-face with the patient during this encounter and over half of that time dealt with counseling and coordination of care.  Herold HarmsMartin A Nicoli Nardozzi, MD  Note: This dictation was prepared with Dragon dictation along with smaller phrase technology. Any transcriptional errors that result from this process are unintentional.

## 2017-04-07 NOTE — Patient Instructions (Signed)
1.  Flagyl 500 mg twice a day for 7 days 2.  Recommend referral to primary care for management of elevated hemoglobin A1c (prediabetes) and hypertriglyceridemia.  Call back with provider name so we can make the appropriate referral for you.

## 2017-10-05 ENCOUNTER — Encounter: Payer: Self-pay | Admitting: General Surgery

## 2018-03-28 NOTE — Progress Notes (Deleted)
ANNUAL PREVENTATIVE CARE GYN  ENCOUNTER NOTE  Subjective:       Shelley Cole is a 32 y.o. G0P0000 female here for a routine annual gynecologic exam.  Current complaints: 1.     Hair growth is still an issue as she does have to shave periodically.  She is on Spironolactone 100 mg a day. Patient has had 5 cycles this year to date.  No significant intermenstrual spotting or bleeding. Bowel and bladder function are normal.  Long history of PCO since age 30. Has irregular cycles, usually more than 4 per year. Occasional intermenstrual spotting.  No history of menorrhagia. Long history of tobacco use.       Gynecologic History LMP-  Contraception: none Last Pap: 03/2017 neg Results were: normal Last mammogram: n/a.  Obstetric History OB History  Gravida Para Term Preterm AB Living  0 0 0 0 0 0  SAB TAB Ectopic Multiple Live Births  0 0 0 0 0    Past Medical History:  Diagnosis Date  . Asthma   . PCOS (polycystic ovarian syndrome)     Past Surgical History:  Procedure Laterality Date  . CHOLECYSTECTOMY    . TONSILLECTOMY      Current Outpatient Medications on File Prior to Visit  Medication Sig Dispense Refill  . metroNIDAZOLE (FLAGYL) 500 MG tablet Take 1 tablet (500 mg total) by mouth 2 (two) times daily. 14 tablet 0  . spironolactone (ALDACTONE) 100 MG tablet Take 1 tablet (100 mg total) daily by mouth. 30 tablet 11   No current facility-administered medications on file prior to visit.     Allergies  Allergen Reactions  . Sulfa Antibiotics Nausea And Vomiting    Social History   Socioeconomic History  . Marital status: Single    Spouse name: Not on file  . Number of children: Not on file  . Years of education: Not on file  . Highest education level: Not on file  Occupational History  . Not on file  Social Needs  . Financial resource strain: Not on file  . Food insecurity:    Worry: Not on file    Inability: Not on file  . Transportation needs:   Medical: Not on file    Non-medical: Not on file  Tobacco Use  . Smoking status: Current Every Day Smoker    Packs/day: 0.25    Types: Cigarettes  . Smokeless tobacco: Never Used  Substance and Sexual Activity  . Alcohol use: Yes    Alcohol/week: 0.0 standard drinks    Comment: rare  . Drug use: No  . Sexual activity: Yes    Partners: Male    Birth control/protection: None  Lifestyle  . Physical activity:    Days per week: 0 days    Minutes per session: 0 min  . Stress: Not at all  Relationships  . Social connections:    Talks on phone: Not on file    Gets together: Not on file    Attends religious service: Not on file    Active member of club or organization: Not on file    Attends meetings of clubs or organizations: Not on file    Relationship status: Not on file  . Intimate partner violence:    Fear of current or ex partner: No    Emotionally abused: No    Physically abused: No    Forced sexual activity: No  Other Topics Concern  . Not on file  Social History Narrative  .  Not on file    Family History  Problem Relation Age of Onset  . COPD Father   . Emphysema Father   . Cancer Maternal Grandmother        pncreatic  . Cancer Maternal Grandfather        nose  . Diabetes Maternal Grandfather   . Arthritis Paternal Grandmother   . COPD Paternal Grandmother   . Emphysema Paternal Grandmother   . Breast cancer Neg Hx   . Ovarian cancer Neg Hx   . Colon cancer Neg Hx     The following portions of the patient's history were reviewed and updated as appropriate: allergies, current medications, past family history, past medical history, past social history, past surgical history and problem list.  Review of Systems      There were no vitals taken for this visit. CONSTITUTIONAL: Well-developed, well-nourished female in no acute distress.  PSYCHIATRIC: Normal mood and affect. Normal behavior. Normal judgment and thought content. NEUROLGIC: Alert and oriented  to person, place, and time. Normal muscle tone coordination. No cranial nerve deficit noted. HENT:  Normocephalic, atraumatic, External right and left ear normal. Oropharynx is clear and moist.  Temporal balding noted EYES: Conjunctivae and EOM are normal. No scleral icterus.  NECK: Normal range of motion, supple, no masses.  Normal thyroid.  SKIN: Skin is warm and dry. No rash noted. Not diaphoretic. No erythema. No pallor. CARDIOVASCULAR: Normal heart rate noted, regular rhythm, no murmur. RESPIRATORY: Clear to auscultation bilaterally. Effort and breath sounds normal, no problems with respiration noted. BREASTS: Symmetric in size. No masses, skin changes, nipple drainage, or lymphadenopathy. ABDOMEN: Soft, normal bowel sounds, no distention noted.  No tenderness, rebound or guarding.  BLADDER: Normal PELVIC:  External Genitalia: Normal  BUS: Normal  Vagina: Normal; minimal menstrual blood in vault  Cervix: Normal; no lesions; no cervical motion tenderness  Uterus: Normal; midplane, normal size and shape, mobile, nontender  Adnexa: Normal  RV: External Exam NormaI  MUSCULOSKELETAL: Normal range of motion. No tenderness.  No cyanosis, clubbing, or edema.  2+ distal pulses. LYMPHATIC: No Axillary, Supraclavicular, or Inguinal Adenopathy.    Assessment:   Annual gynecologic examination 32 y.o. Contraception: none bmi- PCO Irregular menstrual cycles Contraception-not desired  tobacco user  Plan:  Pap: PAP/hpv Mammogram: Not Indicated Stool Guaiac Testing:  Not Indicated Labs:tsh lipid fbs a1c Routine preventative health maintenance measures emphasized: Exercise/Diet/Weight control, Tobacco Warnings, Alcohol/Substance use risks, Stress Management, Peer Pressure Issues and Safe Sex  Strongly encouraged to quit smoking Return to Clinic - 1 Year   Sheliah Plane [place], CMA    Britt Bottom CMA acting as scribe for Dr. Greggory Keen.   Note: This dictation was prepared with Dragon  dictation along with smaller phrase technology. Any transcriptional errors that result from this process are unintentional.

## 2018-03-29 ENCOUNTER — Encounter: Payer: PRIVATE HEALTH INSURANCE | Admitting: Obstetrics and Gynecology

## 2019-10-06 ENCOUNTER — Other Ambulatory Visit: Payer: Self-pay

## 2019-10-06 ENCOUNTER — Emergency Department
Admission: EM | Admit: 2019-10-06 | Discharge: 2019-10-06 | Disposition: A | Discharge status: Home / Self Care | Payer: PRIVATE HEALTH INSURANCE | Attending: Emergency Medicine | Admitting: Emergency Medicine

## 2019-10-06 ENCOUNTER — Emergency Department: Payer: PRIVATE HEALTH INSURANCE

## 2019-10-06 DIAGNOSIS — J4520 Mild intermittent asthma, uncomplicated: Secondary | ICD-10-CM | POA: Diagnosis not present

## 2019-10-06 DIAGNOSIS — J189 Pneumonia, unspecified organism: Secondary | ICD-10-CM | POA: Insufficient documentation

## 2019-10-06 DIAGNOSIS — R05 Cough: Secondary | ICD-10-CM | POA: Diagnosis present

## 2019-10-06 MED ORDER — METHYLPREDNISOLONE 4 MG PO TBPK: ORAL_TABLET | ORAL | 0 refills | Status: AC

## 2019-10-06 MED ORDER — IPRATROPIUM-ALBUTEROL 0.5-2.5 (3) MG/3ML IN SOLN
3.0000 mL | Freq: Once | RESPIRATORY_TRACT | Status: AC
  Administered 2019-10-06: 3 mL via RESPIRATORY_TRACT
  Filled 2019-10-06: qty 3

## 2019-10-06 MED ORDER — AZITHROMYCIN 250 MG PO TABS: ORAL_TABLET | ORAL | 0 refills | Status: AC

## 2019-10-06 MED ORDER — ALBUTEROL SULFATE HFA 108 (90 BASE) MCG/ACT IN AERS: 2.0000 | INHALATION_SPRAY | Freq: Four times a day (QID) | RESPIRATORY_TRACT | 0 refills | Status: AC | PRN

## 2019-10-06 NOTE — Discharge Instructions (Signed)
Follow-up with your regular doctor if not improving in 3 days.  Return emergency department worsening.  Take your medication as prescribed.  Do not take the amoxicillin.

## 2019-10-06 NOTE — ED Provider Notes (Signed)
[facility] Emergency Department Provider Note  ____________________________________________   First MD Initiated Contact with Patient 10/06/19 1008     (approximate)  I have reviewed the triage vital signs and the nursing notes.   HISTORY  Chief Complaint Cough and Nasal Congestion    HPI Shelley Cole is a 34 y.o. female presents emergency department with cough and congestion.  Denies fever.  Patient's been taking amoxicillin since Wednesday, x2 days.  She had a dental abscess.  Now she has had a lot of coughing and congestion along with productive mucus.  She denies fevers or chills.    Past Medical History:  Diagnosis Date  . Asthma   . PCOS (polycystic ovarian syndrome)     Patient Active Problem List   Diagnosis Date Noted  . 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 Encounter for contraceptive management 03/24/2017  . Mild intermittent asthma in adult without complication 04/04/2015  . Other fatigue 04/04/2015  . PCO (polycystic ovaries) 04/04/2015  . Encounter for cholesteral screening for cardiovascular disease 04/04/2015    Past Surgical History:  Procedure Laterality Date  . CHOLECYSTECTOMY    . TONSILLECTOMY      Prior to Admission medications   Medication Sig Start Date End Date Taking? Authorizing Provider  albuterol (VENTOLIN HFA) 108 (90 Base) MCG/ACT inhaler Inhale 2 puffs into the lungs every 6 (six) hours as needed for wheezing or shortness of breath. 10/06/19   Sherrie Mustache, Roselyn Bering, PA-C  azithromycin (ZITHROMAX Z-PAK) 250 MG tablet 2 pills today then 1 pill a day for 4 days 10/06/19   Sherrie Mustache Roselyn Bering, PA-C  methylPREDNISolone (MEDROL DOSEPAK) 4 MG TBPK tablet Take 6 pills on day one then decrease by 1 pill each day 10/06/19   Faythe Ghee, PA-C    Allergies Sulfa antibiotics and Banana  Family History  Problem Relation Age of Onset   . COPD Father   . Emphysema Father   . Cancer Maternal Grandmother        pncreatic  . Cancer Maternal Grandfather        nose  . Diabetes Maternal Grandfather   . Arthritis Paternal Grandmother   . COPD Paternal Grandmother   . Emphysema Paternal Grandmother   . Breast cancer Neg Hx   . Ovarian cancer Neg Hx   . Colon cancer Neg Hx     Social History Social History   Tobacco Use  . Smoking status: Current Every Day Smoker    Packs/day: 0.25    Types: Cigarettes  . Smokeless tobacco: Never Used  Substance Use Topics  . Alcohol use: Yes    Alcohol/week: 0.0 standard drinks    Comment: rare  . Drug use: No    Review of Systems  Constitutional: No fever/chills Eyes: No visual changes. ENT: No sore throat. Respiratory: Positive cough Cardiovascular: Denies chest pain Gastrointestinal: Denies abdominal pain Genitourinary: Negative for dysuria. Musculoskeletal: Negative for back pain. Skin: Negative for rash. Psychiatric: no mood changes,     ____________________________________________   PHYSICAL EXAM:  VITAL SIGNS: ED Triage Vitals  Enc Vitals Group     BP 10/06/19 0837 130/78     Pulse Rate 10/06/19 0837 99     Resp 10/06/19 0837 16     Temp 10/06/19 0837 98.5 F (36.9 C)     Temp Source 10/06/19 0837 Oral     SpO2 10/06/19 0837  95 %     Weight 10/06/19 0838 211 lb (95.7 kg)     Height 10/06/19 0838 5' (1.524 m)     Head Circumference --      Peak Flow --      Pain Score 10/06/19 0838 0     Pain Loc --      Pain Edu? --      Excl. in GC? --     Constitutional: Alert and oriented. Well appearing and in no acute distress. Eyes: Conjunctivae are normal.  Head: Atraumatic. Nose: No congestion/rhinnorhea. Mouth/Throat: Mucous membranes are moist.   Neck:  supple no lymphadenopathy noted Cardiovascular: Normal rate, regular rhythm. Heart sounds are normal Respiratory: Normal respiratory effort.  No retractions, lungs with wheezing bilateral GU:  deferred Musculoskeletal: FROM all extremities, warm and well perfused Neurologic:  Normal speech and language.  Skin:  Skin is warm, dry and intact. No rash noted. Psychiatric: Mood and affect are normal. Speech and behavior are normal.  ____________________________________________   LABS (all labs ordered are listed, but only abnormal results are displayed)  Labs Reviewed - No data to display ____________________________________________   ____________________________________________  RADIOLOGY  Chest x-ray shows a left lower lobe pneumonia  ____________________________________________   PROCEDURES  Procedure(s) performed: DuoNeb   Procedures    ____________________________________________   INITIAL IMPRESSION / ASSESSMENT AND PLAN / ED COURSE  Pertinent labs & imaging results that were available during my care of the patient were reviewed by me and considered in my medical decision making (see chart for details).   Patient is 34 year old female presents emergency department with concerns of cough and congestion.  Patient's been on amoxicillin for 2 days.  No fever.  See HPI  Physical exam shows patient to appear well and stable.  Lungs with diffuse wheezing.  Remainder the exam is unremarkable  Chest x-ray shows a left lower lobe pneumonia.  Did explain findings to the patient.  She be placed on a Z-Pak and is to stop the amoxicillin.  She was given a prescription for Z-Pak, albuterol inhaler, and a Medrol Dosepak.  She is to return emergency department worsening.  See her regular doctor if not improving in 3 days.  States she understands will comply.  Is discharged stable condition.    Shelley Cole was evaluated in Emergency Department on 10/06/2019 for the symptoms described in the history of present illness. She was evaluated in the context of the global COVID-19 pandemic, which necessitated consideration that the patient might be at risk for infection with the  SARS-CoV-2 virus that causes COVID-19. Institutional protocols and algorithms that pertain to the evaluation of patients at risk for COVID-19 are in a state of rapid change based on information released by regulatory bodies including the CDC and federal and state organizations. These policies and algorithms were followed during the patient's care in the ED.   As part of my medical decision making, I reviewed the following data within the electronic MEDICAL RECORD NUMBER Nursing notes reviewed and incorporated, Old chart reviewed, Radiograph reviewed , Notes from prior ED visits and [place] Controlled Substance Database  ____________________________________________   FINAL CLINICAL IMPRESSION(S) / ED DIAGNOSES  Final diagnoses:  Community acquired pneumonia of left lower lobe of lung      NEW MEDICATIONS STARTED DURING THIS VISIT:  New Prescriptions   ALBUTEROL (VENTOLIN HFA) 108 (90 BASE) MCG/ACT INHALER    Inhale 2 puffs into the lungs every 6 (six) hours as needed for wheezing or shortness  of breath.   AZITHROMYCIN (ZITHROMAX Z-PAK) 250 MG TABLET    2 pills today then 1 pill a day for 4 days   METHYLPREDNISOLONE (MEDROL DOSEPAK) 4 MG TBPK TABLET    Take 6 pills on day one then decrease by 1 pill each day     Note:  This document was prepared using Dragon voice recognition software and may include unintentional dictation errors.    Versie Starks, PA-C 10/06/19 1103    Carrie Mew, MD 10/06/19 [phone]

## 2019-10-06 NOTE — ED Notes (Addendum)
See triage note  Presents with cough and congestion  Afebrile   States she was placed on amoxil on weds for dental abscess then the congestion started yesterday

## 2019-10-06 NOTE — ED Triage Notes (Signed)
Cough, nasal congestion that began yesterday. Pt currently taking amoxicillin for tooth abscess prescribed Wednesday. Denies sore throat or fever.

## 2020-06-15 ENCOUNTER — Emergency Department: Payer: PRIVATE HEALTH INSURANCE

## 2020-06-15 ENCOUNTER — Emergency Department
Admission: EM | Admit: 2020-06-15 | Discharge: 2020-06-15 | Disposition: A | Discharge status: Home / Self Care | Payer: PRIVATE HEALTH INSURANCE | Attending: Emergency Medicine | Admitting: Emergency Medicine

## 2020-06-15 DIAGNOSIS — B349 Viral infection, unspecified: Secondary | ICD-10-CM | POA: Insufficient documentation

## 2020-06-15 DIAGNOSIS — J4520 Mild intermittent asthma, uncomplicated: Secondary | ICD-10-CM | POA: Insufficient documentation

## 2020-06-15 DIAGNOSIS — F17210 Nicotine dependence, cigarettes, uncomplicated: Secondary | ICD-10-CM | POA: Insufficient documentation

## 2020-06-15 DIAGNOSIS — U071 COVID-19: Secondary | ICD-10-CM | POA: Diagnosis not present

## 2020-06-15 DIAGNOSIS — M791 Myalgia, unspecified site: Secondary | ICD-10-CM | POA: Diagnosis present

## 2020-06-15 MED ORDER — PREDNISONE 50 MG PO TABS: 50.0000 mg | ORAL_TABLET | Freq: Every day | ORAL | 0 refills | Status: AC

## 2020-06-15 MED ORDER — PSEUDOEPH-BROMPHEN-DM 30-2-10 MG/5ML PO SYRP: 10.0000 mL | ORAL_SOLUTION | Freq: Four times a day (QID) | ORAL | 0 refills | Status: AC | PRN

## 2020-06-15 NOTE — ED Notes (Signed)
Patient declined discharge vital signs. 

## 2020-06-15 NOTE — ED Triage Notes (Signed)
Pt presents via POV c/o body aches, sweating at nights, pt reports no fevers at home. Has not taken Covid test.

## 2020-06-15 NOTE — ED Provider Notes (Signed)
[facility] Emergency Department Provider Note  ____________________________________________  Time seen: Approximately 8:50 PM  I have reviewed the triage vital signs and the nursing notes.   HISTORY  Chief Complaint Generalized Body Aches    HPI Shelley Cole is a 35 y.o. female who presents the emergency department for evaluation of body aches, cold sweats, nasal congestion.  Patient states that she developed symptoms 3 days ago.  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  Patient denies any appreciated fevers.  No chest pain or shortness of breath.  Light cough is reported that is nonproductive in nature.  History of asthma and PCOS.  Patient with over-the-counter medications without significant improvement of her symptoms at home         Past Medical History:  Diagnosis Date  . Asthma   . PCOS (polycystic ovarian syndrome)     Patient Active Problem List   Diagnosis Date Noted  . 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 Encounter for contraceptive management 03/24/2017  . Mild intermittent asthma in adult without complication 04/04/2015  . Other fatigue 04/04/2015  . PCO (polycystic ovaries) 04/04/2015  . Encounter for cholesteral screening for cardiovascular disease 04/04/2015    Past Surgical History:  Procedure Laterality Date  . CHOLECYSTECTOMY    . TONSILLECTOMY      Prior to Admission medications   Medication Sig Start Date End Date Taking? Authorizing Provider  brompheniramine-pseudoephedrine-DM 30-2-10 MG/5ML syrup Take 10 mLs by mouth 4 (four) times daily as needed. 06/15/20  Yes Cuthriell, Delorise Royals, PA-C  predniSONE (DELTASONE) 50 MG tablet Take 1 tablet (50 mg total) by mouth daily with breakfast. 06/15/20  Yes Cuthriell, Delorise Royals, PA-C  albuterol  (VENTOLIN HFA) 108 (90 Base) MCG/ACT inhaler Inhale 2 puffs into the lungs every 6 (six) hours as needed for wheezing or shortness of breath. 10/06/19   Sherrie Mustache, Roselyn Bering, PA-C  azithromycin (ZITHROMAX Z-PAK) 250 MG tablet 2 pills today then 1 pill a day for 4 days 10/06/19   Faythe Ghee, PA-C  methylPREDNISolone (MEDROL DOSEPAK) 4 MG TBPK tablet Take 6 pills on day one then decrease by 1 pill each day 10/06/19   Faythe Ghee, PA-C    Allergies Sulfa antibiotics and Banana  Family History  Problem Relation Age of Onset  . COPD Father   . Emphysema Father   . Cancer Maternal Grandmother        pncreatic  . Cancer Maternal Grandfather        nose  . Diabetes Maternal Grandfather   . Arthritis Paternal Grandmother   . COPD Paternal Grandmother   . Emphysema Paternal Grandmother   . Breast cancer Neg Hx   . Ovarian cancer Neg Hx   . Colon cancer Neg Hx     Social History Social History   Tobacco Use  . Smoking status: Current Every Day Smoker    Packs/day: 0.25    Types: Cigarettes  . Smokeless tobacco: Never Used  Vaping Use  . Vaping Use: Never used  Substance Use Topics  . Alcohol use: Yes    Alcohol/week: 0.0 standard drinks    Comment: rare  . Drug use: No     Review of Systems  Constitutional: Subjective fever/chills.  Positive for body aches Eyes: No visual changes. No discharge ENT:  Positive for nasal congestion Cardiovascular: no chest pain. Respiratory: Occasional dry cough. No SOB. Gastrointestinal: No abdominal pain.  No nausea, no vomiting.  No diarrhea.  No constipation. Musculoskeletal: Negative for musculoskeletal pain. Skin: Negative for rash, abrasions, lacerations, ecchymosis. Neurological: Negative for headaches, focal weakness or numbness.  10 System ROS otherwise negative.  ____________________________________________   PHYSICAL EXAM:  VITAL SIGNS: ED Triage Vitals  Enc Vitals Group     BP 06/15/20 1842 (!) 136/96     Pulse Rate  06/15/20 1842 92     Resp 06/15/20 1842 16     Temp 06/15/20 1842 98.2 F (36.8 C)     Temp Source 06/15/20 1842 Oral     SpO2 06/15/20 1842 98 %     Weight --      Height --      Head Circumference --      Peak Flow --      Pain Score 06/15/20 1841 6     Pain Loc --      Pain Edu? --      Excl. in GC? --      Constitutional: Alert and oriented. Well appearing and in no acute distress. Eyes: Conjunctivae are normal. PERRL. EOMI. Head: Atraumatic. ENT:      Ears: EACs and TMs unremarkable bilaterally      Nose: Mild congestion/rhinnorhea.      Mouth/Throat: Mucous membranes are moist.  No oropharyngeal erythema or edema noted Neck: No stridor.  Neck is supple full range of motion Hematological/Lymphatic/Immunilogical: No cervical lymphadenopathy. Cardiovascular: Normal rate, regular rhythm. Normal S1 and S2.  Good peripheral circulation. Respiratory: Normal respiratory effort without tachypnea or retractions. Lungs CTAB. Good air entry to the bases with no decreased or absent breath sounds. Gastrointestinal: Bowel sounds 4 quadrants. Soft and nontender to palpation. No guarding or rigidity. No palpable masses. No distention. No CVA tenderness. Musculoskeletal: Full range of motion to all extremities. No gross deformities appreciated. Neurologic:  Normal speech and language. No gross focal neurologic deficits are appreciated.  Skin:  Skin is warm, dry and intact. No rash noted. Psychiatric: Mood and affect are normal. Speech and behavior are normal. Patient exhibits appropriate insight and judgement.   ____________________________________________   LABS (all labs ordered are listed, but only abnormal results are displayed)  Labs Reviewed  SARS CORONAVIRUS 2 (TAT 6-24 HRS)   ____________________________________________  EKG   ____________________________________________  RADIOLOGY I personally viewed and evaluated these images as part of my medical decision making, as  well as reviewing the written report by the radiologist.  ED Provider Interpretation: No consolidation concerning for pneumonia  DG Chest 2 View  Result Date: 06/15/2020 CLINICAL DATA:  Cough and body aches EXAM: CHEST - 2 VIEW COMPARISON:  10/06/2019 FINDINGS: The heart size and mediastinal contours are within normal limits. Both lungs are clear. The visualized skeletal structures are unremarkable. IMPRESSION: No active cardiopulmonary disease. Electronically Signed   By: Alcide Clever M.D.   On: 06/15/2020 21:41    ____________________________________________    PROCEDURES  Procedure(s) performed:    Procedures    Medications - No data to display   ____________________________________________   INITIAL IMPRESSION / ASSESSMENT AND PLAN / ED COURSE  Pertinent labs & imaging results that were available during my care of the patient were reviewed by me and considered in my medical decision making (see chart for details).  Review of the  [place]RS was performed in accordance of the [facility] prior to dispensing any controlled drugs.  Patient's diagnosis is consistent with viral illness.  Patient presented to the emergency department with multiple URI symptoms.  Differential included influenza, Covid, viral URI or pneumonia.  Chest x-ray without consolidation concerning for pneumonia.  Patient with reassuring exam.  Patient will have symptom control medications at home.  Covid test is still pending at this time but patient may check her medical record using my chart for results tomorrow..  Return to work precautions discussed with the patient.  Follow-up primary care as needed.  Patient is given ED precautions to return to the ED for any worsening or new symptoms.     ____________________________________________  FINAL CLINICAL IMPRESSION(S) / ED DIAGNOSES  Final diagnoses:  Viral illness      NEW MEDICATIONS STARTED DURING THIS VISIT:  ED Discharge Orders          Ordered    predniSONE (DELTASONE) 50 MG tablet  Daily with breakfast        06/15/20 2231    brompheniramine-pseudoephedrine-DM 30-2-10 MG/5ML syrup  4 times daily PRN        06/15/20 2231              This chart was dictated using voice recognition software/Dragon. Despite best efforts to proofread, errors can occur which can change the meaning. Any change was purely unintentional.    Racheal Patches, PA-C 06/16/20 0042    Shaune Pollack, MD 06/16/20 2042

## 2020-06-16 LAB — SARS CORONAVIRUS 2 (TAT 6-24 HRS): SARS Coronavirus 2: POSITIVE — AB

## 2020-06-17 ENCOUNTER — Telehealth (HOSPITAL_COMMUNITY): Payer: Self-pay | Admitting: Family

## 2020-06-17 ENCOUNTER — Telehealth: Payer: Self-pay

## 2020-06-17 NOTE — Telephone Encounter (Signed)
Called to discuss with patient about COVID-19 symptoms and the use of one of the available treatments for those with mild to moderate Covid symptoms and at a high risk of hospitalization.  Pt appears to qualify for outpatient treatment due to co-morbid conditions and/or a member of an at-risk group in accordance with the FDA Emergency Use Authorization.    Symptom onset: 06/12/20 Vaccinated: Unknown Booster? Unknown Immunocompromised? No Qualifiers: Asthma  Unable to reach pt - Left message and call back number [phone].   Esther Hardy

## 2020-06-17 NOTE — Telephone Encounter (Signed)
Called to discuss with patient about COVID-19 symptoms and the use of one of the available treatments for those with mild to moderate Covid symptoms and at a high risk of hospitalization.  Pt appears to qualify for outpatient treatment due to co-morbid conditions and/or a member of an at-risk group in accordance with the FDA Emergency Use Authorization.    Unable to reach pt - VM left  Quandra Fedorchak   

## 2021-06-24 ENCOUNTER — Other Ambulatory Visit: Payer: Self-pay

## 2021-06-24 ENCOUNTER — Emergency Department
Admission: EM | Admit: 2021-06-24 | Discharge: 2021-06-24 | Disposition: A | Discharge status: Home / Self Care | Payer: PRIVATE HEALTH INSURANCE | Attending: Emergency Medicine | Admitting: Emergency Medicine

## 2021-06-24 ENCOUNTER — Emergency Department: Payer: PRIVATE HEALTH INSURANCE

## 2021-06-24 ENCOUNTER — Encounter: Payer: Self-pay | Admitting: Emergency Medicine

## 2021-06-24 DIAGNOSIS — Z20822 Contact with and (suspected) exposure to covid-19: Secondary | ICD-10-CM | POA: Insufficient documentation

## 2021-06-24 DIAGNOSIS — R000 Tachycardia, unspecified: Secondary | ICD-10-CM | POA: Diagnosis not present

## 2021-06-24 DIAGNOSIS — R059 Cough, unspecified: Secondary | ICD-10-CM | POA: Insufficient documentation

## 2021-06-24 LAB — RESP PANEL BY RT-PCR (FLU A&B, COVID) ARPGX2
Influenza A by PCR: NEGATIVE
Influenza B by PCR: NEGATIVE
SARS Coronavirus 2 by RT PCR: NEGATIVE

## 2021-06-24 MED ORDER — BENZONATATE 100 MG PO CAPS: 100.0000 mg | ORAL_CAPSULE | Freq: Three times a day (TID) | ORAL | 0 refills | Status: AC | PRN

## 2021-06-24 NOTE — ED Provider Notes (Signed)
[facility] Provider Note  Patient Contact: 6:32 PM (approximate)   History   Cough and Nasal Congestion   HPI  Shelley Cole is a 36 y.o. female presents to the emergency department with cough, nasal congestion and rhinorrhea for the past 2 days.  She reports that her cough is productive.  She denies chest pain, chest tightness and shortness of breath.  States that she did have community-acquired pneumonia earlier in the year.  No recent travel, prolonged immobilization or recent surgery.     Physical Exam   Triage Vital Signs: ED Triage Vitals  Enc Vitals Group     BP 06/24/21 1746 116/82     Pulse Rate 06/24/21 1746 (!) 120     Resp 06/24/21 1746 18     Temp 06/24/21 1746 97.9 F (36.6 C)     Temp Source 06/24/21 1746 Oral     SpO2 06/24/21 1746 100 %     Weight 06/24/21 1745 210 lb 15.7 oz (95.7 kg)     Height 06/24/21 1745 5\' 1"  (1.549 m)     Head Circumference --      Peak Flow --      Pain Score 06/24/21 1745 0     Pain Loc --      Pain Edu? --      Excl. in GC? --     Most recent vital signs: Vitals:   06/24/21 1746  BP: 116/82  Pulse: (!) 120  Resp: 18  Temp: 97.9 F (36.6 C)  SpO2: 100%    Constitutional: Alert and oriented. Patient is lying supine. Eyes: Conjunctivae are normal. PERRL. EOMI. Head: Atraumatic. ENT:      Ears: Tympanic membranes are mildly injected with mild effusion bilaterally.       Nose: No congestion/rhinnorhea.      Mouth/Throat: Mucous membranes are moist. Posterior pharynx is mildly erythematous.  Hematological/Lymphatic/Immunilogical: No cervical lymphadenopathy.  Cardiovascular: Normal rate, regular rhythm. Normal S1 and S2.  Good peripheral circulation. Respiratory: Normal respiratory effort without tachypnea or retractions. Lungs CTAB. Good air entry to the bases with no decreased or absent breath sounds. Gastrointestinal: Bowel sounds 4 quadrants. Soft and nontender to palpation. No guarding  or rigidity. No palpable masses. No distention. No CVA tenderness. Musculoskeletal: Full range of motion to all extremities. No gross deformities appreciated. Neurologic:  Normal speech and language. No gross focal neurologic deficits are appreciated.  Skin:  Skin is warm, dry and intact. No rash noted. Psychiatric: Mood and affect are normal. Speech and behavior are normal. Patient exhibits appropriate insight and judgement.    ED Results / Procedures / Treatments   Labs (all labs ordered are listed, but only abnormal results are displayed) Labs Reviewed  RESP PANEL BY RT-PCR (FLU A&B, COVID) [id number]        PROCEDURES:  Critical Care performed: No  Procedures   MEDICATIONS ORDERED IN ED: Medications - No data to display   IMPRESSION / MDM / ASSESSMENT AND PLAN / ED COURSE  I reviewed the triage vital signs and the nursing notes.                              Differential diagnosis includes, but is not limited to, COVID-19, influenza, community-acquired pneumonia, viral URI  Assessment and plan Cough 36 year old female presents appointment with cough, rhinorrhea and nasal congestion for the past 2 days.  Patient was tachycardic at triage but  vital signs otherwise reassuring.  She was alert and nontoxic-appearing on exam with no adventitious lung sounds auscultated.  Chest x-ray showed no consolidations, opacities or infiltrates.  COVID-19 and influenza testing are in process at this time.  Recommended supportive measures at home including increased hydration and Tylenol and ibuprofen alternating for fever.  Return precautions were given to return with new or worsening symptoms.      FINAL CLINICAL IMPRESSION(S) / ED DIAGNOSES   Final diagnoses:  Cough     Rx / DC Orders   ED Discharge Orders          Ordered    benzonatate (TESSALON PERLES) 100 MG capsule  3 times daily PRN        06/24/21 1828             Note:  This document was prepared using  Dragon voice recognition software and may include unintentional dictation errors.   Pia Mau [place], PA-C 06/24/21 Janetta Hora    Sharman Cheek, MD 06/29/21 [phone]

## 2021-06-24 NOTE — ED Triage Notes (Signed)
Pt comes into the ED via POV c/o cough and congestion in her chest x 2 days.  Pt ambulatory to triage with even and unlabored respirations.

## 2021-06-24 NOTE — Discharge Instructions (Addendum)
You can take Tessalon Perles at night before bed along with Mucinex DM. You can take 10 mg of Zyrtec once daily.

## 2022-03-03 IMAGING — CR DG CHEST 2V
1 series · 2 of 2 positions shown · non-contrast
Comparison: 10/06/2019

CLINICAL DATA: Cough and body aches

EXAM:
CHEST - 2 VIEW

[Series 1: dg chest 2 view · 0.14mm/px · 2 of 2 slices shown]
[im 1/2]
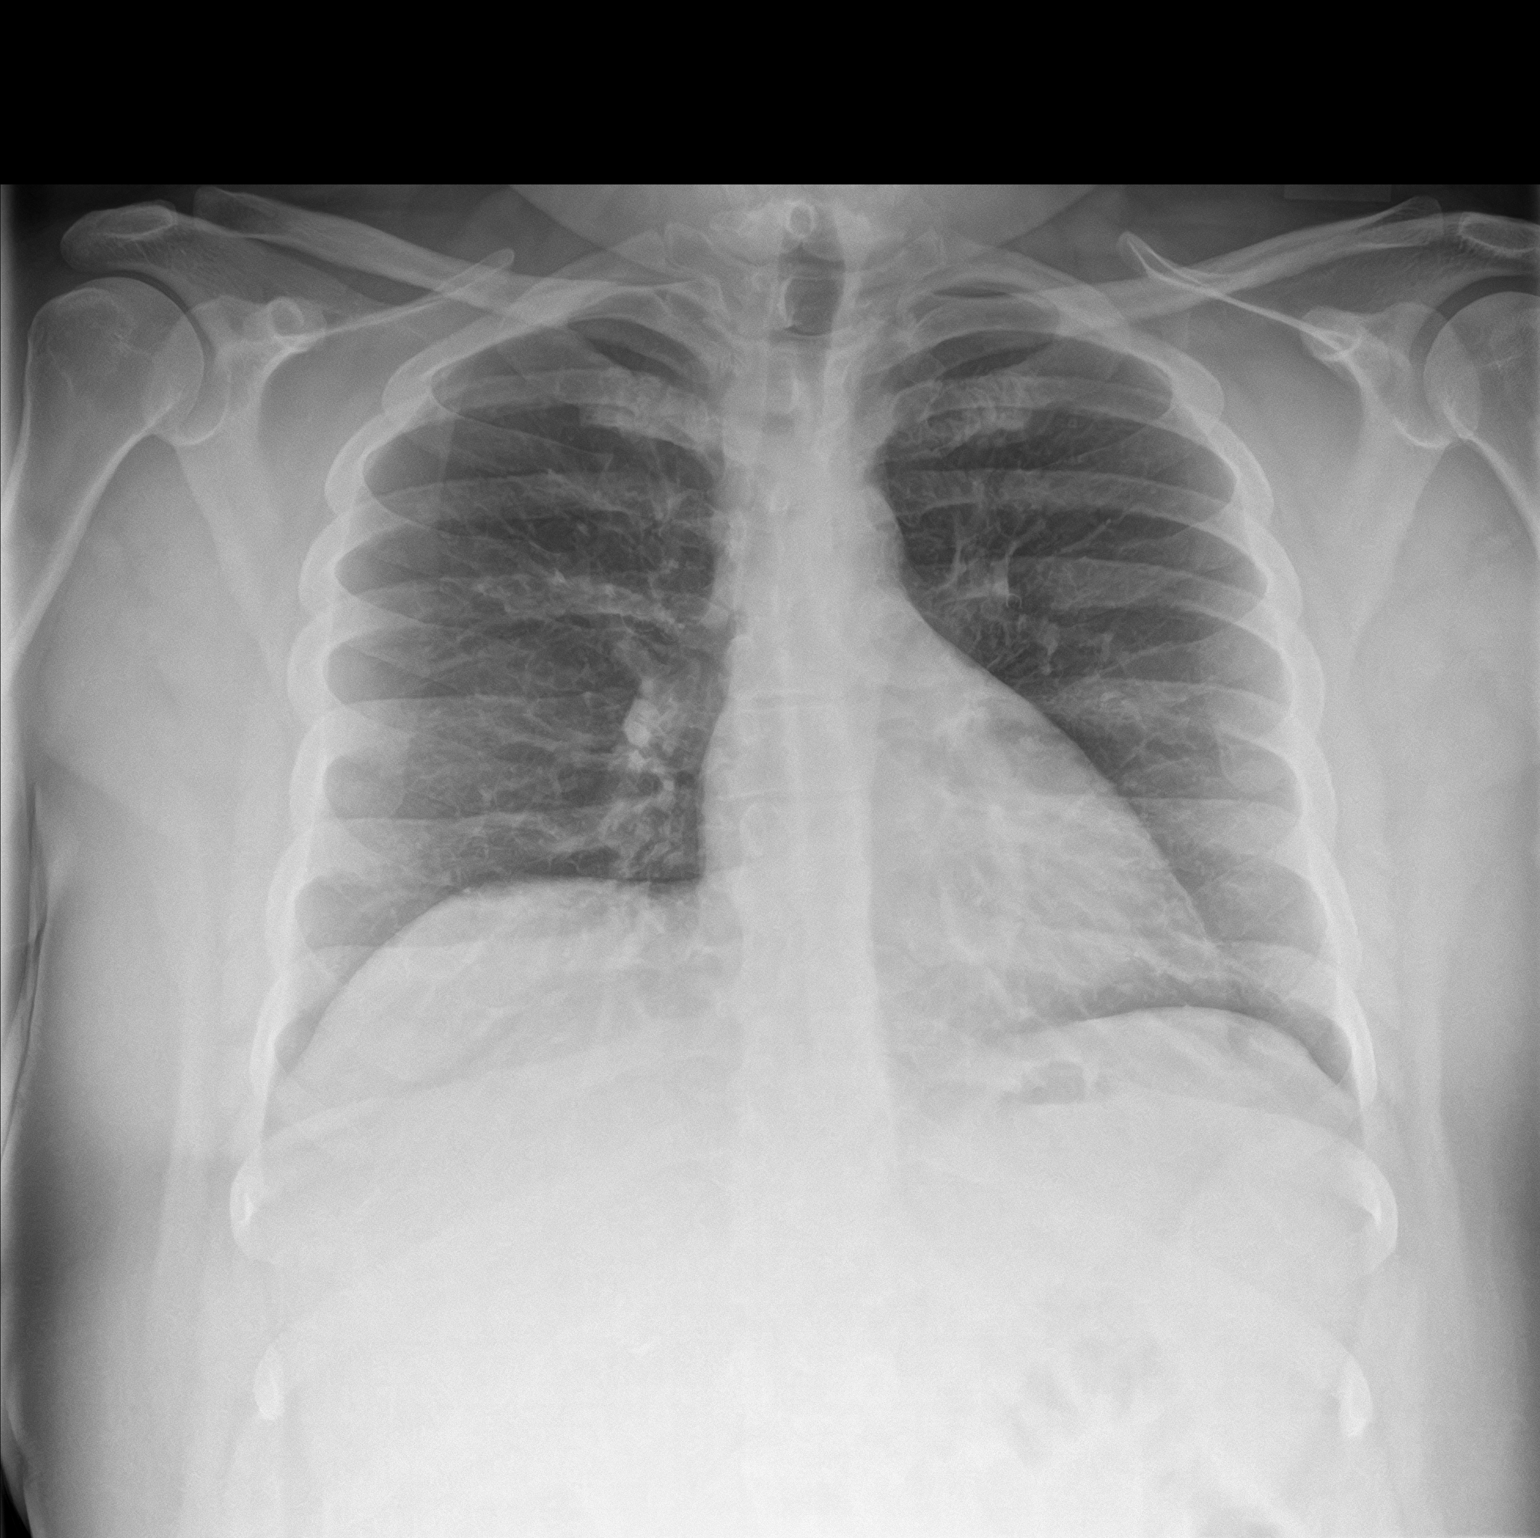
[im 2/2]
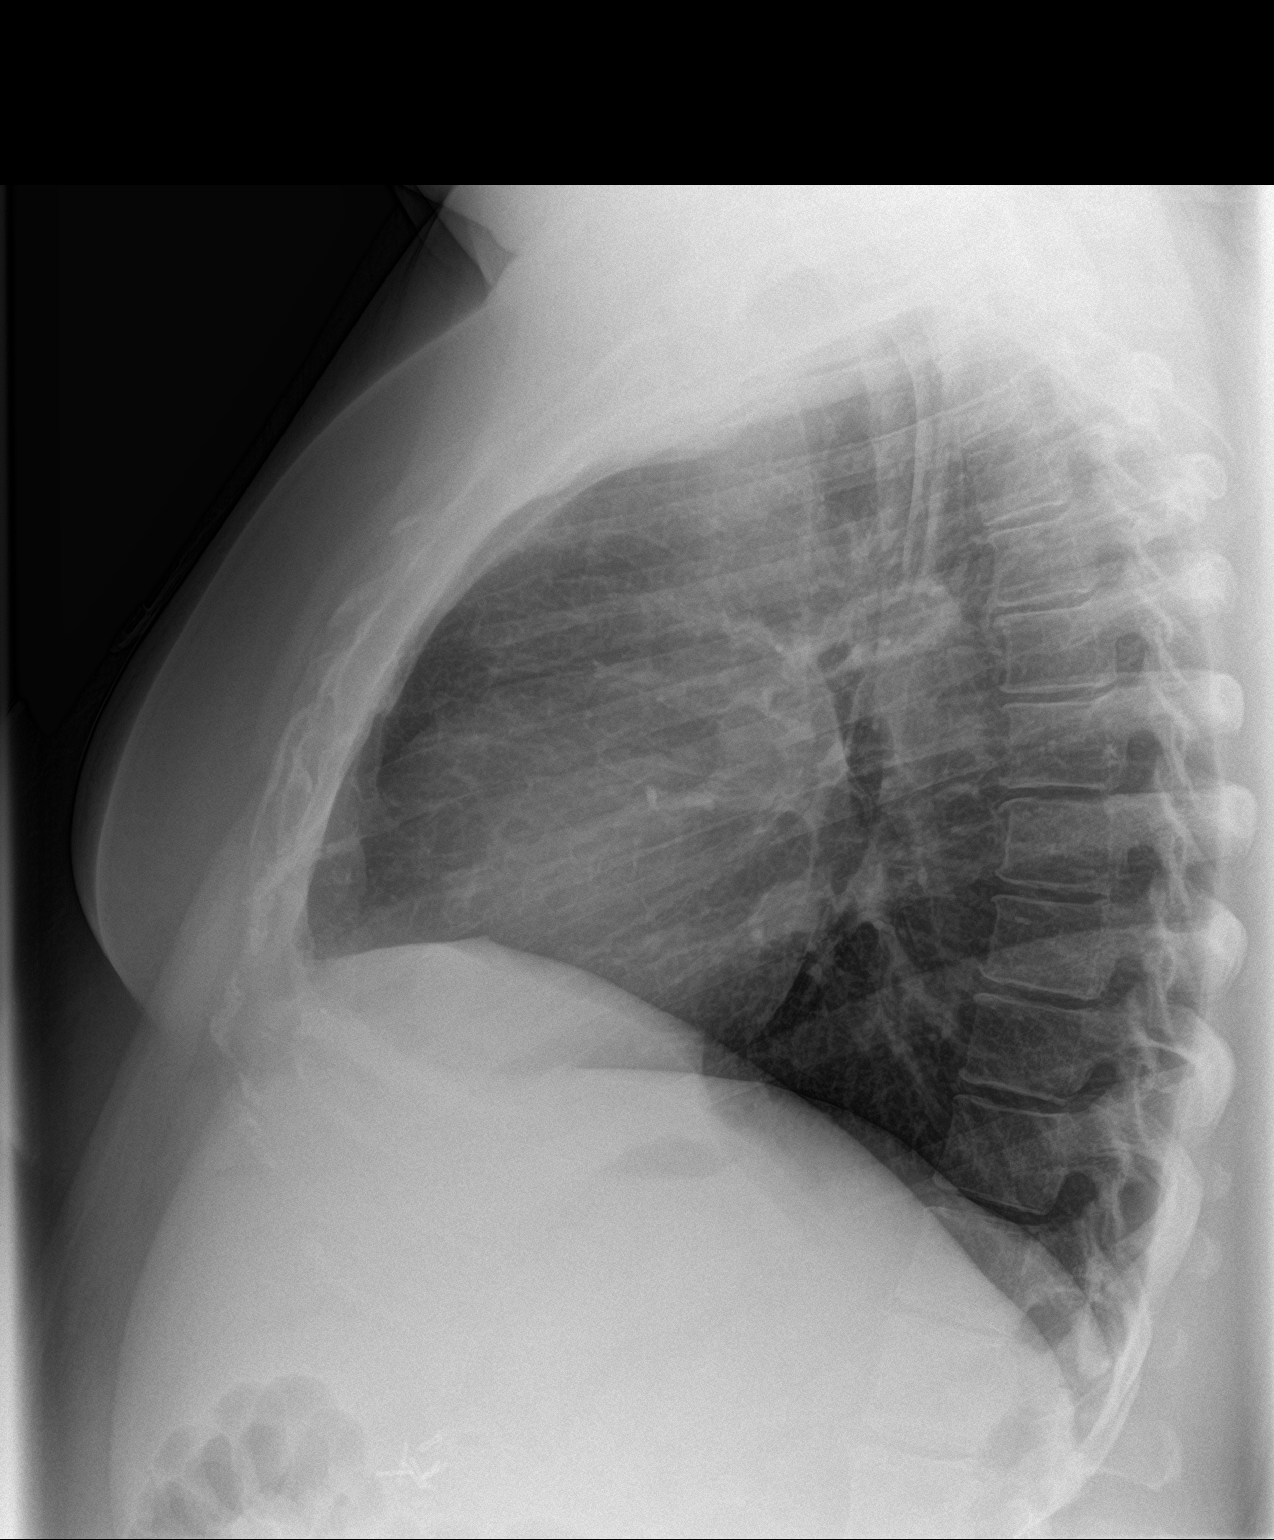

[2 of 2 positions shown; findings below may reference images not displayed]

FINDINGS: The heart size and mediastinal contours are within normal limits.
Both lungs are clear. The visualized skeletal structures are
unremarkable.
IMPRESSION: No active cardiopulmonary disease.

## 2022-12-17 ENCOUNTER — Other Ambulatory Visit: Payer: Self-pay

## 2022-12-17 ENCOUNTER — Emergency Department
Admission: EM | Admit: 2022-12-17 | Discharge: 2022-12-17 | Disposition: A | Discharge status: Home / Self Care | Payer: Medicaid Other | Attending: Emergency Medicine | Admitting: Emergency Medicine

## 2022-12-17 DIAGNOSIS — L03114 Cellulitis of left upper limb: Secondary | ICD-10-CM | POA: Diagnosis present

## 2022-12-17 DIAGNOSIS — J45909 Unspecified asthma, uncomplicated: Secondary | ICD-10-CM | POA: Diagnosis not present

## 2022-12-17 LAB — CBC
HCT: 45.9 % (ref 36.0–46.0)
Hemoglobin: 15.9 g/dL — ABNORMAL HIGH (ref 12.0–15.0)
MCH: 32.7 pg (ref 26.0–34.0)
MCHC: 34.6 g/dL (ref 30.0–36.0)
MCV: 94.4 fL (ref 80.0–100.0)
Platelets: 304 10*3/uL (ref 150–400)
RBC: 4.86 MIL/uL (ref 3.87–5.11)
RDW: 11.7 % (ref 11.5–15.5)
WBC: 9.4 10*3/uL (ref 4.0–10.5)
nRBC: 0 % (ref 0.0–0.2)

## 2022-12-17 LAB — BASIC METABOLIC PANEL
Anion gap: 11 (ref 5–15)
BUN: 16 mg/dL (ref 6–20)
CO2: 25 mmol/L (ref 22–32)
Calcium: 9.3 mg/dL (ref 8.9–10.3)
Chloride: 103 mmol/L (ref 98–111)
Creatinine, Ser: 0.87 mg/dL (ref 0.44–1.00)
GFR, Estimated: >60 mL/min (ref >60)
Glucose, Bld: 121 mg/dL — ABNORMAL HIGH (ref 70–99)
Potassium: 4.2 mmol/L (ref 3.5–5.1)
Sodium: 139 mmol/L (ref 135–145)

## 2022-12-17 MED ORDER — DOXYCYCLINE HYCLATE 100 MG PO CAPS: 100.0000 mg | ORAL_CAPSULE | Freq: Two times a day (BID) | ORAL | 0 refills | Status: AC

## 2022-12-17 NOTE — ED Provider Notes (Signed)
[facility] Provider Note    Event Date/Time   First MD Initiated Contact with Patient 12/17/22 1103     (approximate)   History   No chief complaint on file.   HPI  Shelley Cole is a 37 y.o. female with history of asthma, PCOS,  and as listed in EMR presents to the emergency department for treatment and evaluation of lesion to left should that has been present for the past few days. She didn't see/feel anything bite her. Area started as small, red bump and is now draining with a dark center and surrounding redness. She denies pain or fever.     Physical Exam   Triage Vital Signs: ED Triage Vitals  Encounter Vitals Group     BP 12/17/22 1056 111/78     Systolic BP Percentile --      Diastolic BP Percentile --      Pulse Rate 12/17/22 1056 91     Resp 12/17/22 1054 20     Temp 12/17/22 1054 98.2 F (36.8 C)     Temp src --      SpO2 12/17/22 1056 98 %     Weight 12/17/22 1054 170 lb (77.1 kg)     Height 12/17/22 1054 5' (1.524 m)     Head Circumference --      Peak Flow --      Pain Score 12/17/22 1054 0     Pain Loc --      Pain Education --      Exclude from Growth Chart --     Most recent vital signs: Vitals:   12/17/22 1054 12/17/22 1056  BP:  111/78  Pulse:  91  Resp: 20   Temp: 98.2 F (36.8 C)   SpO2:  98%    General: Awake, no distress.  CV:  Good peripheral perfusion.  Resp:  Normal effort.  Abd:  No distention.  Other:  Single lesion on left anterior shoulder with central area of dark scab and surrounding erythema.   ED Results / Procedures / Treatments   Labs (all labs ordered are listed, but only abnormal results are displayed) Labs Reviewed  CBC - Abnormal; Notable for the following components:      Result Value   Hemoglobin 15.9 (*)    All other components within normal limits  BASIC METABOLIC PANEL - Abnormal; Notable for the following components:   Glucose, Bld 121 (*)    All other components within  normal limits     EKG  Not indicated.   RADIOLOGY  Image and radiology report reviewed and interpreted by me. Radiology report consistent with the same.  Not indicated.  PROCEDURES:  Critical Care performed: No  Procedures   MEDICATIONS ORDERED IN ED:  Medications - No data to display   IMPRESSION / MDM / ASSESSMENT AND PLAN / ED COURSE   I have reviewed the triage note.  Differential diagnosis includes, but is not limited to, cellulitis, abscess  Patient's presentation is most consistent with acute, uncomplicated illness.  37 year old female presenting to the emergency department for treatment and evaluation of lesion on her left shoulder.  See HPI for further details.  Area is draining a purulent discharge but there is no focal fluctuant area that will require incision and drainage.  There is surrounding edema without lymphangitis.   No MRSA history.  Plan will be to put her on antibiotic and have her see primary care or return to the emergency  department if it is not improving over the next 2 to 3 days.      FINAL CLINICAL IMPRESSION(S) / ED DIAGNOSES   Final diagnoses:  Cellulitis of left upper extremity     Rx / DC Orders   ED Discharge Orders          Ordered    doxycycline (VIBRAMYCIN) 100 MG capsule  2 times daily        12/17/22 1133             Note:  This document was prepared using Dragon voice recognition software and may include unintentional dictation errors.   Chinita Pester, FNP 12/17/22 1523    Merwyn Katos, MD 12/18/22 [phone]

## 2022-12-17 NOTE — ED Triage Notes (Signed)
Pt to ED for possible insect bite to left chest 2 days ago. Denies fevers. Significant redness and drainage noted to area.

## 2023-06-08 ENCOUNTER — Emergency Department
Admission: EM | Admit: 2023-06-08 | Discharge: 2023-06-08 | Disposition: A | Discharge status: Home / Self Care | Payer: Medicaid Other | Attending: Emergency Medicine | Admitting: Emergency Medicine

## 2023-06-08 ENCOUNTER — Emergency Department: Payer: Medicaid Other

## 2023-06-08 ENCOUNTER — Encounter: Payer: Self-pay | Admitting: Intensive Care

## 2023-06-08 ENCOUNTER — Other Ambulatory Visit: Payer: Self-pay

## 2023-06-08 DIAGNOSIS — M546 Pain in thoracic spine: Secondary | ICD-10-CM | POA: Diagnosis not present

## 2023-06-08 DIAGNOSIS — M545 Low back pain, unspecified: Secondary | ICD-10-CM | POA: Diagnosis present

## 2023-06-08 DIAGNOSIS — Y92410 Unspecified street and highway as the place of occurrence of the external cause: Secondary | ICD-10-CM | POA: Insufficient documentation

## 2023-06-08 DIAGNOSIS — J45909 Unspecified asthma, uncomplicated: Secondary | ICD-10-CM | POA: Diagnosis not present

## 2023-06-08 LAB — POC URINE PREG, ED: Preg Test, Ur: NEGATIVE

## 2023-06-08 MED ORDER — CYCLOBENZAPRINE HCL 5 MG PO TABS: 5.0000 mg | ORAL_TABLET | Freq: Three times a day (TID) | ORAL | 0 refills | Status: AC | PRN

## 2023-06-08 MED ORDER — KETOROLAC TROMETHAMINE 30 MG/ML IJ SOLN
30.0000 mg | Freq: Once | INTRAMUSCULAR | Status: AC
  Administered 2023-06-08: 30 mg via INTRAMUSCULAR
  Filled 2023-06-08: qty 1
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
